# Patient Record
Sex: Female | Born: 1990
Health system: Southern US, Community
[De-identification: ages and names within clinical notes are randomized; demographics above are authoritative.]

## PROBLEM LIST (undated history)

## (undated) DIAGNOSIS — Z8679 Personal history of other diseases of the circulatory system: Secondary | ICD-10-CM

## (undated) DIAGNOSIS — Z789 Other specified health status: Secondary | ICD-10-CM

## (undated) HISTORY — DX: Personal history of other diseases of the circulatory system: Z86.79

## (undated) HISTORY — PX: WISDOM TOOTH EXTRACTION: SHX21

## (undated) HISTORY — DX: Other specified health status: Z78.9

---
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
## 2012-05-09 ENCOUNTER — Encounter: Payer: Self-pay | Admitting: Obstetrics and Gynecology

## 2012-06-13 ENCOUNTER — Ambulatory Visit (INDEPENDENT_AMBULATORY_CARE_PROVIDER_SITE_OTHER): Payer: 59 | Admitting: Obstetrics and Gynecology

## 2012-06-13 ENCOUNTER — Encounter: Payer: Self-pay | Admitting: Obstetrics and Gynecology

## 2012-06-13 VITALS — BP 122/72 | Ht 65.0 in | Wt 117.0 lb

## 2012-06-13 DIAGNOSIS — B354 Tinea corporis: Secondary | ICD-10-CM

## 2012-06-13 DIAGNOSIS — Z113 Encounter for screening for infections with a predominantly sexual mode of transmission: Secondary | ICD-10-CM

## 2012-06-13 DIAGNOSIS — Z0000 Encounter for general adult medical examination without abnormal findings: Secondary | ICD-10-CM

## 2012-06-13 DIAGNOSIS — Z01419 Encounter for gynecological examination (general) (routine) without abnormal findings: Secondary | ICD-10-CM

## 2012-06-13 LAB — HEPATITIS C ANTIBODY: HCV Ab: NEGATIVE

## 2012-06-13 LAB — HIV ANTIBODY (ROUTINE TESTING W REFLEX): HIV: NONREACTIVE

## 2012-06-13 MED ORDER — MICONAZOLE NITRATE 2 % EX CREA: TOPICAL_CREAM | Freq: Two times a day (BID) | CUTANEOUS | Status: DC

## 2012-06-13 MED ORDER — NORETHIN ACE-ETH ESTRAD-FE 1-20 MG-MCG PO TABS: 1.0000 | ORAL_TABLET | Freq: Every day | ORAL | Status: DC

## 2012-06-13 NOTE — Patient Instructions (Addendum)
EXERCISE AND DIET:  We recommended that you start or continue a regular exercise program for good health. Regular exercise means any activity that makes your heart beat faster and makes you sweat.  We recommend exercising at least 30 minutes per day at least 3 days a week, preferably 4 or 5.  We also recommend a diet low in fat and sugar.  Inactivity, poor dietary choices and obesity can cause diabetes, heart attack, stroke, and kidney damage, among others.    ALCOHOL AND SMOKING:  Women should limit their alcohol intake to no more than 7 drinks/beers/glasses of wine (combined, not each!) per week. Moderation of alcohol intake to this level decreases your risk of breast cancer and liver damage. And of course, no recreational drugs are part of a healthy lifestyle.  And absolutely no smoking or even second hand smoke. Most people know smoking can cause heart and lung diseases, but did you know it also contributes to weakening of your bones? Aging of your skin?  Yellowing of your teeth and nails?  CALCIUM AND VITAMIN D:  Adequate intake of calcium and Vitamin D are recommended.  The recommendations for exact amounts of these supplements seem to change often, but generally speaking 600 mg of calcium (either carbonate or citrate) and 800 units of Vitamin D per day seems prudent. Certain women may benefit from higher intake of Vitamin D.  If you are among these women, your doctor will have told you during your visit.    PAP SMEARS:  Pap smears, to check for cervical cancer or precancers,  have traditionally been done yearly, although recent scientific advances have shown that most women can have pap smears less often.  However, every woman still should have a physical exam from her gynecologist every year. It will include a breast check, inspection of the vulva and vagina to check for abnormal growths or skin changes, a visual exam of the cervix, and then an exam to evaluate the size and shape of the uterus and  ovaries.  And after 22 years of age, a rectal exam is indicated to check for rectal cancers. We will also provide age appropriate advice regarding health maintenance, like when you should have certain vaccines, screening for sexually transmitted diseases, bone density testing, colonoscopy, mammograms, etc.   MAMMOGRAMS:  All women over 40 years old should have a yearly mammogram. Many facilities now offer a "3D" mammogram, which may cost around $50 extra out of pocket. If possible,  we recommend you accept the option to have the 3D mammogram performed.  It both reduces the number of women who will be called back for extra views which then turn out to be normal, and it is better than the routine mammogram at detecting truly abnormal areas.    COLONOSCOPY:  Colonoscopy to screen for colon cancer is recommended for all women at age 50.  We know, you hate the idea of the prep.  We agree, BUT, having colon cancer and not knowing it is worse!!  Colon cancer so often starts as a polyp that can be seen and removed at colonscopy, which can quite literally save your life!  And if your first colonoscopy is normal and you have no family history of colon cancer, most women don't have to have it again for 10 years.  Once every ten years, you can do something that may end up saving your life, right?  We will be happy to help you get it scheduled when you are ready.    Be sure to check your insurance coverage so you understand how much it will cost.  It may be covered as a preventative service at no cost, but you should check your particular policy.    Fungus Infection of the Skin An infection of your skin caused by a fungus is a very common problem. Treatment depends on which part of the body is affected. Types of fungal skin infection include:  Athlete's Foot(Tinea pedis). This infection starts between the toes and may involve the entire sole and sides of foot. It is the most common fungal disease. It is made worse by  heat, moisture, and friction. To treat, wash your feet 2 to 3 times daily. Dry thoroughly between the toes. Use medicated foot powder or cream as directed on the package. Plain talc, cornstarch, or rice powder may be dusted into socks and shoes to keep the feet dry. Wearing footwear that allows ventilation is also helpful.  Ringworm (Tinea corporis and tinea capitis). This infection causes scaly red rings to form on the skin or scalp. For skin sores, apply medicated lotion or cream as directed on the package. For the scalp, medicated shampoo may be used with with other therapies. Ringworm of the scalp or fingernails usually requires using oral medicine for 2 to 4 months.  Tinea versicolor. This infection appears as painless, scaly, patchy areas of discolored skin (whitish to light brown). It is more common in the summer and favors oily areas of the skin such as those found at the chest, abdomen, back, pubis, neck, and body folds. It can be treated with medicated shampoo or with medicated topical cream. Oral antifungals may be needed for more active infections. The light and/or dark spots may take time to get better and is not a sign of treatment failure. Fungal infections may need to be treated for several weeks to be cured. It is important not to treat fungal infections with steroids or combination medicine that contains an antifungal and steroid as these will make the fungal infection worse. SEEK MEDICAL CARE IF:   You have persistent itching or rawness.  You have an oral temperature above 102 F (38.9 C). Document Released: 03/09/2004 Document Revised: 04/24/2011 Document Reviewed: 05/25/2009 [facility] Patient Information 2013 [place], [place]. Oral Contraception Information Oral contraceptives (OCs) are medicines taken to prevent pregnancy. OCs work by preventing the ovaries from releasing eggs. The hormones in OCs also cause the cervical mucus to thicken, preventing the sperm from entering the  uterus. The hormones also cause the uterine lining to become thin, not allowing a fertilized egg to attach to the inside of the uterus. OCs are highly effective when taken exactly as prescribed. However, OCs do not prevent sexually transmitted diseases (STDs). Safe sex practices, such as using condoms along with the pill, can help prevent STDs.  Before taking the pill, you may have a physical exam and Pap test. Your caregiver may order blood tests that may be necessary. Your caregiver will make sure you are a good candidate for oral contraception. Discuss with your caregiver the possible side effects of the OC you may be prescribed. When starting an OC, it can take 2 to 3 months for the body to adjust to the changes in hormone levels in your body.  TYPES OF ORAL CONTRACEPTION  The combination pill. This pill contains estrogen and progestin (synthetic progesterone) hormones. The combination pill comes in either 21-day or 28-day packs. With 21-day packs, you do not take pills for 7 days after the  last pill. With 28-day packs, the pill is taken every day. The last 7 pills are without hormones. Certain types of pills have more than 21 hormone-containing pills.  The minipill. This pill contains the progesterone hormone only. It is taken every day continuously. The minipill comes in packs of 91 pills. The first 84 pills contain the hormones, and the last 7 pills do not. The last 7 days are when you will have your menstrual period. You may experience irregular spotting. ADVANTAGES  Decreases premenstrual symptoms.  Treats menstrual period cramps.  Regulates the menstrual cycle.  Decreases a heavy menstrual flow.  Treats acne.  Treats abnormal uterine bleeding.  Treats chronic pelvic pain.  Treats polycystic ovarian syndrome.  Treats endometriosis.  Can be used as emergency contraception. DISADVANTAGES OCs can be less effective if:  You forget to take the pill at the same time every  day.  You have a stomach or intestinal disease that lessens the absorption of the pill.  You take OCs with other medicines that make OCs less effective.  You take expired OCs.  You forget to restart the pill on day 7, when using the packs of 21 pills. Document Released: 04/22/2002 Document Revised: 04/24/2011 Document Reviewed: 06/08/2010 [facility] Patient Information 2013 [place], [place].

## 2012-06-13 NOTE — Progress Notes (Signed)
Patient ID: Brittney Collins, female   DOB: [birth year], 22 y.o.   MRN: [id number] 22 y.o.  Single  Caucasian female   G0P0 here for annual exam.   Wants to start OCPs. Has bad cramping for first one to two days.  Aleve or Advil works most of the time.    Patient's last menstrual period was 06/02/2012.          Sexually active: yes  The current method of family planning is condoms all the time.    Exercising: No Last mammogram:  never Last pap smear: never History of abnormal pap: n/a Smoking: no Alcohol: rare Last colonoscopy: never Last Bone Density:  never Last tetanus shot: up to date Last cholesterol check: unsure  Hgb: 14.1               Urine:  Unable to void.    No health maintenance topics applied.  History reviewed. No pertinent family history.  There are no active problems to display for this patient.   History reviewed. No pertinent past medical history.  Past Surgical History  Procedure Laterality Date  . Wisdom tooth extraction      Allergies: Review of patient's allergies indicates no known allergies.  No current outpatient prescriptions on file.   No current facility-administered medications for this visit.    ROS: Pertinent items are noted in HPI.  Social Hx:  Patient is in committed relationship.  Is a CMA at [employer].  Exam:    BP 122/72  Ht 5\' 5"  (1.651 m)  Wt 117 lb (53.071 kg)  BMI 19.47 kg/m2  LMP 06/02/2012   Wt Readings from Last 3 Encounters:  06/13/12 117 lb (53.071 kg)     Ht Readings from Last 3 Encounters:  06/13/12 5\' 5"  (1.651 m)    General appearance: alert, cooperative and appears stated age Head: Normocephalic, without obvious abnormality, atraumatic Neck: no adenopathy, supple, symmetrical, trachea midline and thyroid not enlarged, symmetric, no tenderness/mass/nodules Lungs: clear to auscultation bilaterally Breasts: Inspection negative, No nipple retraction or dimpling, No nipple discharge or bleeding, No  axillary or supraclavicular adenopathy, Normal to palpation without dominant masses Heart: regular rate and rhythm Abdomen: soft, non-tender; bowel sounds normal; no masses,  no organomegaly Extremities: extremities normal, atraumatic, no cyanosis or edema Skin: Skin color, texture, turgor normal. No rashes or lesions.  Very tanned skin.  Has 2 circular nonraised patches lacking pigmentation on lower chest. Lymph nodes: Cervical, supraclavicular, and axillary nodes normal. No abnormal inguinal nodes palpated Neurologic: Grossly normal   Pelvic: External genitalia:  no lesions              Urethra:  normal appearing urethra with no masses, tenderness or lesions              Bartholins and Skenes: normal                 Vagina: normal appearing vagina with normal color and discharge, no lesions              Cervix: normal appearance              Pap taken: yes and reflex HR HPV and GC/CT.        Bimanual Exam:  Uterus:  uterus is normal size, shape, consistency and nontender  Adnexa: normal adnexa in size, nontender and no masses                                      Rectovaginal: Confirms                                      Anus:  normal sphincter tone, no lesions  A: normal gyn exam Need for reliable contraception No prior STD testing Tinea corporis.     P: mammogram at age 41 pap smear with reflex HPV and GC/CT STD panel - HIV, RPR, Hep C aby Condoms for STD prevention LoEstrin 1/20 - see EPIC orders.  Patient instructed in use, risks, and benefits. Miconazole topical 2% cream to skin bid for 2 - 3 weeks. If skin lesions persist, see your PCP.  return annually or prn     An After Visit Summary was printed and given to the patient.

## 2012-06-14 LAB — RPR

## 2012-06-17 LAB — IPS PAP TEST WITH REFLEX TO HPV

## 2012-06-18 LAB — IPS N GONORRHOEA AND CHLAMYDIA BY PCR

## 2012-06-21 LAB — HEMOGLOBIN, FINGERSTICK: Hemoglobin, fingerstick: 14.1 g/dL (ref 12.0–16.0)

## 2012-12-19 ENCOUNTER — Other Ambulatory Visit: Payer: Self-pay

## 2013-05-28 ENCOUNTER — Encounter: Payer: Self-pay | Admitting: Obstetrics and Gynecology

## 2013-06-20 ENCOUNTER — Ambulatory Visit (INDEPENDENT_AMBULATORY_CARE_PROVIDER_SITE_OTHER): Payer: 59 | Admitting: Obstetrics and Gynecology

## 2013-06-20 ENCOUNTER — Encounter: Payer: Self-pay | Admitting: Obstetrics and Gynecology

## 2013-06-20 ENCOUNTER — Ambulatory Visit: Payer: 59 | Admitting: Obstetrics and Gynecology

## 2013-06-20 VITALS — BP 110/74 | HR 64 | Ht 65.0 in | Wt 124.2 lb

## 2013-06-20 DIAGNOSIS — Z113 Encounter for screening for infections with a predominantly sexual mode of transmission: Secondary | ICD-10-CM

## 2013-06-20 DIAGNOSIS — Z0000 Encounter for general adult medical examination without abnormal findings: Secondary | ICD-10-CM

## 2013-06-20 DIAGNOSIS — Z01419 Encounter for gynecological examination (general) (routine) without abnormal findings: Secondary | ICD-10-CM

## 2013-06-20 LAB — POCT URINALYSIS DIPSTICK
Bilirubin, UA: NEGATIVE
Blood, UA: NEGATIVE
Glucose, UA: NEGATIVE
Ketones, UA: NEGATIVE
Leukocytes, UA: NEGATIVE
Nitrite, UA: NEGATIVE
Protein, UA: NEGATIVE
Urobilinogen, UA: NEGATIVE
pH, UA: 8

## 2013-06-20 LAB — HEMOGLOBIN, FINGERSTICK: Hemoglobin, fingerstick: 14.7 g/dL (ref 12.0–16.0)

## 2013-06-20 NOTE — Progress Notes (Signed)
Patient ID: Brittney Collins, female   DOB: September 21, 1990, 23 y.o.   MRN: [id number] GYNECOLOGY VISIT  [facility] [place]  Referring provider:   HPI: 23 y.o.   Single  Caucasian  female   G0P0 with Patient's last menstrual period was 05/28/2013.   here for  AEX.   Did not start OCPs. Does not want to take pills. Steady partner for 2 years. Wants STD testing.   No Gardasil vaccine yet.    Mother just diagnosed with breast cancer at age 23.   Hgb:   14.7 Urine:  Neg  GYNECOLOGIC HISTORY: Patient's last menstrual period was 05/28/2013. Sexually active:  yes Partner preference: female Contraception:  Condoms everytime  Menopausal hormone therapy: n/a DES exposure:   no Blood transfusions:  no  Sexually transmitted diseases:   no GYN procedures and prior surgeries:  no Last mammogram:   n/a              Last pap and high risk HPV testing:   06-13-12 wnl History of abnormal pap smear:  no   OB History   Grav Para Term Preterm Abortions TAB SAB Ect Mult Living   0                LIFESTYLE: Exercise:   no            Tobacco:  no Alcohol:     no Drug use:  no  OTHER HEALTH MAINTENANCE: Tetanus/TDap:   2010 Gardisil:              no Influenza:            2013 Zostavax:            n/a  Bone density:      n/a Colonoscopy:      n/a  Cholesterol check:   never  Family History  Problem Relation Age of Onset  . Breast cancer Mother [id number]  . Hypertension Mother     There are no active problems to display for this patient.  History reviewed. No pertinent past medical history.  Past Surgical History  Procedure Laterality Date  . Wisdom tooth extraction      ALLERGIES: Review of patient's allergies indicates no known allergies.  No current outpatient prescriptions on file.   No current facility-administered medications for this visit.     ROS:  Pertinent items are noted in HPI.  SOCIAL HISTORY:  Engineer, siteMedical assistant.   PHYSICAL  EXAMINATION:    BP 110/74  Pulse 64  Ht 5\' 5"  (1.651 m)  Wt 124 lb 3.2 oz (56.337 kg)  BMI 20.67 kg/m2  LMP 05/28/2013   Wt Readings from Last 3 Encounters:  06/20/13 124 lb 3.2 oz (56.337 kg)  06/13/12 117 lb (53.071 kg)     Ht Readings from Last 3 Encounters:  06/20/13 5\' 5"  (1.651 m)  06/13/12 5\' 5"  (1.651 m)    General appearance: alert, cooperative and appears stated age Head: Normocephalic, without obvious abnormality, atraumatic Neck: no adenopathy, supple, symmetrical, trachea midline and thyroid not enlarged, symmetric, no tenderness/mass/nodules Lungs: clear to auscultation bilaterally Breasts: Inspection negative, No nipple retraction or dimpling, No nipple discharge or bleeding, No axillary or supraclavicular adenopathy, Normal to palpation without dominant masses Heart: regular rate and rhythm Abdomen: soft, non-tender; no masses,  no organomegaly Extremities: extremities normal, atraumatic, no cyanosis or edema Skin: Skin color, texture, turgor normal. No rashes or lesions Lymph nodes: Cervical, supraclavicular, and axillary nodes  normal. No abnormal inguinal nodes palpated Neurologic: Grossly normal  Pelvic: External genitalia:  no lesions              Urethra:  normal appearing urethra with no masses, tenderness or lesions              Bartholins and Skenes: normal                 Vagina: normal appearing vagina with normal color and discharge, no lesions              Cervix: normal appearance              Pap and high risk HPV testing done: no.            Bimanual Exam:  Uterus:  uterus is normal size, shape, consistency and nontender                                      Adnexa: normal adnexa in size, nontender and no masses                                        ASSESSMENT  Normal gynecologic exam. Condoms for birth control Family history of breast cancer.   PLAN  Mammogram recommended yearly starting age 23.  Pap smear in 2 years. Counseled on self  breast exam, Calcium and vitamin D intake, exercise. I discussed PNV as a way to reduce spina bifida and miscarriageif did become pregnant.  STD testing today.  Return annually or prn   An After Visit Summary was printed and given to the patient.

## 2013-06-20 NOTE — Patient Instructions (Signed)

## 2013-06-21 LAB — GC/CHLAMYDIA PROBE AMP, URINE
Chlamydia, Swab/Urine, PCR: NEGATIVE
GC Probe Amp, Urine: NEGATIVE

## 2013-06-21 LAB — STD PANEL
HIV 1&2 Ab, 4th Generation: NONREACTIVE
Hepatitis B Surface Ag: NEGATIVE

## 2013-06-21 LAB — HEPATITIS C ANTIBODY: HCV Ab: NEGATIVE

## 2013-10-27 ENCOUNTER — Encounter: Payer: Self-pay | Admitting: Obstetrics and Gynecology

## 2013-11-28 ENCOUNTER — Other Ambulatory Visit: Payer: Self-pay

## 2014-06-26 ENCOUNTER — Ambulatory Visit: Payer: 59 | Admitting: Obstetrics and Gynecology

## 2014-07-16 ENCOUNTER — Ambulatory Visit (INDEPENDENT_AMBULATORY_CARE_PROVIDER_SITE_OTHER): Payer: 59 | Admitting: Otolaryngology

## 2014-07-16 DIAGNOSIS — R040 Epistaxis: Secondary | ICD-10-CM

## 2014-08-31 ENCOUNTER — Encounter: Payer: Self-pay | Admitting: Obstetrics and Gynecology

## 2014-08-31 ENCOUNTER — Ambulatory Visit (INDEPENDENT_AMBULATORY_CARE_PROVIDER_SITE_OTHER): Payer: Commercial Managed Care - HMO | Admitting: Obstetrics and Gynecology

## 2014-08-31 VITALS — BP 120/72 | HR 76 | Resp 16 | Ht 66.0 in | Wt 119.6 lb

## 2014-08-31 DIAGNOSIS — Z0000 Encounter for general adult medical examination without abnormal findings: Secondary | ICD-10-CM | POA: Diagnosis not present

## 2014-08-31 DIAGNOSIS — Z113 Encounter for screening for infections with a predominantly sexual mode of transmission: Secondary | ICD-10-CM

## 2014-08-31 DIAGNOSIS — Z01419 Encounter for gynecological examination (general) (routine) without abnormal findings: Secondary | ICD-10-CM

## 2014-08-31 LAB — LIPID PANEL
Cholesterol: 150 mg/dL (ref 0–200)
HDL: 51 mg/dL (ref >=46)
LDL Cholesterol: 85 mg/dL (ref 0–99)
Total CHOL/HDL Ratio: 2.9 Ratio
Triglycerides: 68 mg/dL (ref <150)
VLDL: 14 mg/dL (ref 0–40)

## 2014-08-31 LAB — COMPREHENSIVE METABOLIC PANEL
ALT: 12 U/L (ref 0–35)
AST: 16 U/L (ref 0–37)
Albumin: 4.4 g/dL (ref 3.5–5.2)
Alkaline Phosphatase: 36 U/L — ABNORMAL LOW (ref 39–117)
BUN: 13 mg/dL (ref 6–23)
CO2: 26 mEq/L (ref 19–32)
Calcium: 10 mg/dL (ref 8.4–10.5)
Chloride: 106 mEq/L (ref 96–112)
Creat: 0.73 mg/dL (ref 0.50–1.10)
Glucose, Bld: 89 mg/dL (ref 70–99)
Potassium: 3.8 mEq/L (ref 3.5–5.3)
Sodium: 141 mEq/L (ref 135–145)
Total Bilirubin: 0.4 mg/dL (ref 0.2–1.2)
Total Protein: 7.2 g/dL (ref 6.0–8.3)

## 2014-08-31 LAB — CBC
HCT: 40.2 % (ref 36.0–46.0)
Hemoglobin: 13.5 g/dL (ref 12.0–15.0)
MCH: 30.5 pg (ref 26.0–34.0)
MCHC: 33.6 g/dL (ref 30.0–36.0)
MCV: 91 fL (ref 78.0–100.0)
MPV: 9.5 fL (ref 8.6–12.4)
Platelets: 317 10*3/uL (ref 150–400)
RBC: 4.42 MIL/uL (ref 3.87–5.11)
RDW: 13.3 % (ref 11.5–15.5)
WBC: 7.2 10*3/uL (ref 4.0–10.5)

## 2014-08-31 LAB — POCT URINALYSIS DIPSTICK
Bilirubin, UA: NEGATIVE
Blood, UA: NEGATIVE
Glucose, UA: NEGATIVE
Ketones, UA: NEGATIVE
Leukocytes, UA: NEGATIVE
Nitrite, UA: NEGATIVE
Protein, UA: NEGATIVE
Urobilinogen, UA: NEGATIVE
pH, UA: 8

## 2014-08-31 NOTE — Progress Notes (Signed)
24 y.o. G0P0 Single Caucasian female here for annual exam.    Wants to have general labs and STD testing OK to do.   Getting married in September.   No problems with menses.  No problems with heaviness or crampiness.  PCP:   None  No LMP recorded.  08/21/14 Sexually active: Yes.    The current method of family planning is condoms.    Exercising: No.  none Smoker:  no  Health Maintenance: Pap:  06/13/12 Neg.  History of abnormal Pap:  no MMG:  Never TDaP:  2010 Screening Labs: drawn  Hb today:13.9, Urine today: neg PH: 8.0   reports that she has never smoked. She does not have any smokeless tobacco history on file. She reports that she does not drink alcohol or use illicit drugs.  No past medical history on file.  Past Surgical History  Procedure Laterality Date  . Wisdom tooth extraction      No current outpatient prescriptions on file.   No current facility-administered medications for this visit.    Family History  Problem Relation Age of Onset  . Breast cancer Mother 22  . Hypertension Mother     ROS:  Pertinent items are noted in HPI.  Otherwise, a comprehensive ROS was negative.  Exam:   There were no vitals taken for this visit.    General appearance: alert, cooperative and appears stated age Head: Normocephalic, without obvious abnormality, atraumatic Neck: no adenopathy, supple, symmetrical, trachea midline and thyroid normal to inspection and palpation Lungs: clear to auscultation bilaterally Breasts: normal appearance, no masses or tenderness, Inspection negative, No nipple retraction or dimpling, No nipple discharge or bleeding, No axillary or supraclavicular adenopathy Heart: regular rate and rhythm Abdomen: soft, non-tender; bowel sounds normal; no masses,  no organomegaly Extremities: extremities normal, atraumatic, no cyanosis or edema Skin: Skin color, texture, turgor normal. No rashes or lesions Lymph nodes: Cervical, supraclavicular, and axillary  nodes normal. No abnormal inguinal nodes palpated Neurologic: Grossly normal  Pelvic: External genitalia:  no lesions              Urethra:  normal appearing urethra with no masses, tenderness or lesions              Bartholins and Skenes: normal                 Vagina: normal appearing vagina with normal color and discharge, no lesions              Cervix: no lesions              Pap taken: No. Bimanual Exam:  Uterus:  normal size, contour, position, consistency, mobility, non-tender              Adnexa: normal adnexa and no mass, fullness, tenderness              Rectovaginal: No..    Chaperone was present for exam.  Assessment:   Well woman visit with normal exam.  FH of breast cancer.   Plan: Yearly mammogram recommended after age 26.  Recommended self breast exam.  Pap and HR HPV as above. Discussed Calcium, Vitamin D, regular exercise program including cardiovascular and weight bearing exercise. Labs performed.  Yes.  .   See orders. Refills given on medications.  No..  See orders. Follow up annually and prn.   Additional counseling givenregarding contraception options - pills, Ortho Evra, NuvaRing, Skyla, ParaGard, Nexplanon, Depo Provera. Written information as well.  Will return if would like further discussion after doing more study. In the mean time, use condoms with spermicide.  After visit summary provided.

## 2014-08-31 NOTE — Patient Instructions (Signed)
Contraception Choices Contraception (birth control) is the use of any methods or devices to prevent pregnancy. Below are some methods to help avoid pregnancy. HORMONAL METHODS   Contraceptive implant. This is a thin, plastic tube containing progesterone hormone. It does not contain estrogen hormone. Your health care provider inserts the tube in the inner part of the upper arm. The tube can remain in place for up to 3 years. After 3 years, the implant must be removed. The implant prevents the ovaries from releasing an egg (ovulation), thickens the cervical mucus to prevent sperm from entering the uterus, and thins the lining of the inside of the uterus.  Progesterone-only injections. These injections are given every 3 months by your health care provider to prevent pregnancy. This synthetic progesterone hormone stops the ovaries from releasing eggs. It also thickens cervical mucus and changes the uterine lining. This makes it harder for sperm to survive in the uterus.  Birth control pills. These pills contain estrogen and progesterone hormone. They work by preventing the ovaries from releasing eggs (ovulation). They also cause the cervical mucus to thicken, preventing the sperm from entering the uterus. Birth control pills are prescribed by a health care provider.Birth control pills can also be used to treat heavy periods.  Minipill. This type of birth control pill contains only the progesterone hormone. They are taken every day of each month and must be prescribed by your health care provider.  Birth control patch. The patch contains hormones similar to those in birth control pills. It must be changed once a week and is prescribed by a health care provider.  Vaginal ring. The ring contains hormones similar to those in birth control pills. It is left in the vagina for 3 weeks, removed for 1 week, and then a new one is put back in place. The patient must be comfortable inserting and removing the ring  from the vagina.A health care provider's prescription is necessary.  Emergency contraception. Emergency contraceptives prevent pregnancy after unprotected sexual intercourse. This pill can be taken right after sex or up to 5 days after unprotected sex. It is most effective the sooner you take the pills after having sexual intercourse. Most emergency contraceptive pills are available without a prescription. Check with your pharmacist. Do not use emergency contraception as your only form of birth control. BARRIER METHODS   Female condom. This is a thin sheath (latex or rubber) that is worn over the penis during sexual intercourse. It can be used with spermicide to increase effectiveness.  Female condom. This is a soft, loose-fitting sheath that is put into the vagina before sexual intercourse.  Diaphragm. This is a soft, latex, dome-shaped barrier that must be fitted by a health care provider. It is inserted into the vagina, along with a spermicidal jelly. It is inserted before intercourse. The diaphragm should be left in the vagina for 6 to 8 hours after intercourse.  Cervical cap. This is a round, soft, latex or plastic cup that fits over the cervix and must be fitted by a health care provider. The cap can be left in place for up to 48 hours after intercourse.  Sponge. This is a soft, circular piece of polyurethane foam. The sponge has spermicide in it. It is inserted into the vagina after wetting it and before sexual intercourse.  Spermicides. These are chemicals that kill or block sperm from entering the cervix and uterus. They come in the form of creams, jellies, suppositories, foam, or tablets. They do not require a   prescription. They are inserted into the vagina with an applicator before having sexual intercourse. The process must be repeated every time you have sexual intercourse. INTRAUTERINE CONTRACEPTION  Intrauterine device (IUD). This is a T-shaped device that is put in a woman's uterus  during a menstrual period to prevent pregnancy. There are 2 types:  Copper IUD. This type of IUD is wrapped in copper wire and is placed inside the uterus. Copper makes the uterus and fallopian tubes produce a fluid that kills sperm. It can stay in place for 10 years.  Hormone IUD. This type of IUD contains the hormone progestin (synthetic progesterone). The hormone thickens the cervical mucus and prevents sperm from entering the uterus, and it also thins the uterine lining to prevent implantation of a fertilized egg. The hormone can weaken or kill the sperm that get into the uterus. It can stay in place for 3-5 years, depending on which type of IUD is used. PERMANENT METHODS OF CONTRACEPTION  Female tubal ligation. This is when the woman's fallopian tubes are surgically sealed, tied, or blocked to prevent the egg from traveling to the uterus.  Hysteroscopic sterilization. This involves placing a small coil or insert into each fallopian tube. Your doctor uses a technique called hysteroscopy to do the procedure. The device causes scar tissue to form. This results in permanent blockage of the fallopian tubes, so the sperm cannot fertilize the egg. It takes about 3 months after the procedure for the tubes to become blocked. You must use another form of birth control for these 3 months.  Female sterilization. This is when the female has the tubes that carry sperm tied off (vasectomy).This blocks sperm from entering the vagina during sexual intercourse. After the procedure, the man can still ejaculate fluid (semen). NATURAL PLANNING METHODS  Natural family planning. This is not having sexual intercourse or using a barrier method (condom, diaphragm, cervical cap) on days the woman could become pregnant.  Calendar method. This is keeping track of the length of each menstrual cycle and identifying when you are fertile.  Ovulation method. This is avoiding sexual intercourse during ovulation.  Symptothermal  method. This is avoiding sexual intercourse during ovulation, using a thermometer and ovulation symptoms.  Post-ovulation method. This is timing sexual intercourse after you have ovulated. Regardless of which type or method of contraception you choose, it is important that you use condoms to protect against the transmission of sexually transmitted infections (STIs). Talk with your health care provider about which form of contraception is most appropriate for you. Document Released: 01/30/2005 Document Revised: 02/04/2013 Document Reviewed: 07/25/2012 [facility] Patient Information 2015 [place], [place]. This information is not intended to replace advice given to you by your health care provider. Make sure you discuss any questions you have with your health care provider.  EXERCISE AND DIET:  We recommended that you start or continue a regular exercise program for good health. Regular exercise means any activity that makes your heart beat faster and makes you sweat.  We recommend exercising at least 30 minutes per day at least 3 days a week, preferably 4 or 5.  We also recommend a diet low in fat and sugar.  Inactivity, poor dietary choices and obesity can cause diabetes, heart attack, stroke, and kidney damage, among others.    ALCOHOL AND SMOKING:  Women should limit their alcohol intake to no more than 7 drinks/beers/glasses of wine (combined, not each!) per week. Moderation of alcohol intake to this level decreases your risk of breast  cancer and liver damage. And of course, no recreational drugs are part of a healthy lifestyle.  And absolutely no smoking or even second hand smoke. Most people know smoking can cause heart and lung diseases, but did you know it also contributes to weakening of your bones? Aging of your skin?  Yellowing of your teeth and nails?  CALCIUM AND VITAMIN D:  Adequate intake of calcium and Vitamin D are recommended.  The recommendations for exact amounts of these supplements seem  to change often, but generally speaking 600 mg of calcium (either carbonate or citrate) and 800 units of Vitamin D per day seems prudent. Certain women may benefit from higher intake of Vitamin D.  If you are among these women, your doctor will have told you during your visit.    PAP SMEARS:  Pap smears, to check for cervical cancer or precancers,  have traditionally been done yearly, although recent scientific advances have shown that most women can have pap smears less often.  However, every woman still should have a physical exam from her gynecologist every year. It will include a breast check, inspection of the vulva and vagina to check for abnormal growths or skin changes, a visual exam of the cervix, and then an exam to evaluate the size and shape of the uterus and ovaries.  And after 24 years of age, a rectal exam is indicated to check for rectal cancers. We will also provide age appropriate advice regarding health maintenance, like when you should have certain vaccines, screening for sexually transmitted diseases, bone density testing, colonoscopy, mammograms, etc.   MAMMOGRAMS:  All women over 336 years old should have a yearly mammogram. Many facilities now offer a "3D" mammogram, which may cost around $50 extra out of pocket. If possible,  we recommend you accept the option to have the 3D mammogram performed.  It both reduces the number of women who will be called back for extra views which then turn out to be normal, and it is better than the routine mammogram at detecting truly abnormal areas.    COLONOSCOPY:  Colonoscopy to screen for colon cancer is recommended for all women at age 24.  We know, you hate the idea of the prep.  We agree, BUT, having colon cancer and not knowing it is worse!!  Colon cancer so often starts as a polyp that can be seen and removed at colonscopy, which can quite literally save your life!  And if your first colonoscopy is normal and you have no family history of colon  cancer, most women don't have to have it again for 10 years.  Once every ten years, you can do something that may end up saving your life, right?  We will be happy to help you get it scheduled when you are ready.  Be sure to check your insurance coverage so you understand how much it will cost.  It may be covered as a preventative service at no cost, but you should check your particular policy.

## 2014-09-01 LAB — STD PANEL
HIV 1&2 Ab, 4th Generation: NONREACTIVE
Hepatitis B Surface Ag: NEGATIVE

## 2014-09-01 LAB — HEPATITIS C ANTIBODY: HCV Ab: NEGATIVE

## 2014-09-02 LAB — IPS N GONORRHOEA AND CHLAMYDIA BY PCR

## 2015-09-03 ENCOUNTER — Ambulatory Visit: Payer: 59 | Admitting: Obstetrics and Gynecology

## 2015-09-10 ENCOUNTER — Ambulatory Visit (INDEPENDENT_AMBULATORY_CARE_PROVIDER_SITE_OTHER): Payer: Commercial Managed Care - HMO | Admitting: Obstetrics and Gynecology

## 2015-09-10 ENCOUNTER — Encounter: Payer: Self-pay | Admitting: Obstetrics and Gynecology

## 2015-09-10 VITALS — BP 102/60 | HR 56 | Resp 12 | Ht 64.75 in | Wt 116.2 lb

## 2015-09-10 DIAGNOSIS — Z0000 Encounter for general adult medical examination without abnormal findings: Secondary | ICD-10-CM

## 2015-09-10 DIAGNOSIS — Z01419 Encounter for gynecological examination (general) (routine) without abnormal findings: Secondary | ICD-10-CM

## 2015-09-10 LAB — COMPREHENSIVE METABOLIC PANEL
ALT: 15 U/L (ref 6–29)
AST: 18 U/L (ref 10–30)
Albumin: 4.5 g/dL (ref 3.6–5.1)
Alkaline Phosphatase: 40 U/L (ref 33–115)
BUN: 10 mg/dL (ref 7–25)
CO2: 24 mmol/L (ref 20–31)
Calcium: 9.7 mg/dL (ref 8.6–10.2)
Chloride: 104 mmol/L (ref 98–110)
Creat: 0.74 mg/dL (ref 0.50–1.10)
Glucose, Bld: 79 mg/dL (ref 65–99)
Potassium: 3.8 mmol/L (ref 3.5–5.3)
Sodium: 139 mmol/L (ref 135–146)
Total Bilirubin: 0.3 mg/dL (ref 0.2–1.2)
Total Protein: 7.1 g/dL (ref 6.1–8.1)

## 2015-09-10 LAB — LIPID PANEL
Cholesterol: 185 mg/dL (ref 125–200)
HDL: 63 mg/dL (ref >=46)
LDL Cholesterol: 103 mg/dL (ref <130)
Total CHOL/HDL Ratio: 2.9 Ratio (ref <=5.0)
Triglycerides: 95 mg/dL (ref <150)
VLDL: 19 mg/dL (ref <30)

## 2015-09-10 LAB — POCT URINALYSIS DIPSTICK
Bilirubin, UA: NEGATIVE
Blood, UA: NEGATIVE
Glucose, UA: NEGATIVE
Ketones, UA: NEGATIVE
Leukocytes, UA: NEGATIVE
Nitrite, UA: NEGATIVE
Protein, UA: NEGATIVE
Urobilinogen, UA: NEGATIVE
pH, UA: 8

## 2015-09-10 LAB — CBC
HCT: 40.9 % (ref 35.0–45.0)
Hemoglobin: 13.9 g/dL (ref 11.7–15.5)
MCH: 30.7 pg (ref 27.0–33.0)
MCHC: 34 g/dL (ref 32.0–36.0)
MCV: 90.3 fL (ref 80.0–100.0)
MPV: 9.5 fL (ref 7.5–12.5)
Platelets: 304 10*3/uL (ref 140–400)
RBC: 4.53 MIL/uL (ref 3.80–5.10)
RDW: 14.1 % (ref 11.0–15.0)
WBC: 8 10*3/uL (ref 3.8–10.8)

## 2015-09-10 NOTE — Progress Notes (Signed)
25 y.o. G0P0 Married Caucasian female here for annual exam.    Satisfied with condoms for contraception.   Works at [employer].  PCP:   Dr. Phillips Odor.   Patient's last menstrual period was 08/24/2015.     Period Cycle (Days): 28 Period Duration (Days): 7 Period Pattern: Regular Menstrual Flow: Heavy, Moderate Menstrual Control: Tampon Menstrual Control Change Freq (Hours): 1-2 hours on heavy days Dysmenorrhea: None     Sexually active: Yes.    The current method of family planning is Condoms.    Exercising: No.  The patient does not participate in regular exercise at present. Smoker:  no  Health Maintenance: Pap:  06/13/2012 WNL History of abnormal Pap:  no TDaP:  2010 Gardasil:   No.  Will consider.  HIV: 08/31/14 Neg Hep C:08/31/14 Neg Screening Labs:  Hb today: , Urine today: Neg   reports that she has never smoked. She has never used smokeless tobacco. She reports that she does not drink alcohol or use drugs.  No past medical history on file.  Past Surgical History:  Procedure Laterality Date  . WISDOM TOOTH EXTRACTION      No current outpatient prescriptions on file.   No current facility-administered medications for this visit.     Family History  Problem Relation Age of Onset  . Breast cancer Mother 25  . Hypertension Mother   not certain if her mother's aunt may have had breast cancer.  ROS:  Pertinent items are noted in HPI.  Otherwise, a comprehensive ROS was negative.  Exam:   BP 102/60 (BP Location: Right Arm, Patient Position: Sitting, Cuff Size: Normal)   Pulse (!) 56   Resp 12   Ht 5' 4.75" (1.645 m)   Wt 116 lb 3.2 oz (52.7 kg)   LMP 08/24/2015   BMI 19.49 kg/m     General appearance: alert, cooperative and appears stated age Head: Normocephalic, without obvious abnormality, atraumatic Neck: no adenopathy, supple, symmetrical, trachea midline and thyroid normal to inspection and palpation Lungs: clear to auscultation  bilaterally Breasts: normal appearance, no masses or tenderness, No nipple retraction or dimpling, No nipple discharge or bleeding, No axillary or supraclavicular adenopathy Heart: regular rate and rhythm Abdomen: soft, non-tender; no masses, no organomegaly Extremities: extremities normal, atraumatic, no cyanosis or edema Skin: Skin color, texture, turgor normal. No rashes or lesions Lymph nodes: Cervical, supraclavicular, and axillary nodes normal. No abnormal inguinal nodes palpated Neurologic: Grossly normal  Pelvic: External genitalia:  no lesions              Urethra:  normal appearing urethra with no masses, tenderness or lesions              Bartholins and Skenes: normal                 Vagina: normal appearing vagina with normal color and discharge, no lesions              Cervix: no lesions              Pap taken: Yes.   Bimanual Exam:  Uterus:  normal size, contour, position, consistency, mobility, non-tender              Adnexa: no mass, fullness, tenderness        Chaperone was present for exam.  Assessment:   Well woman visit with normal exam. FH breast cancer in mother.   Plan: Yearly mammogram recommended after age 26.  Recommended self breast  exam.  Pap and HR HPV as above. Discussed Calcium, Vitamin D, regular exercise program including cardiovascular and weight bearing exercise. Discussed Gardasil.  She will consider.  She may choose to do this at her employer's office. Routine labs drawn.   Follow up annually and prn.     After visit summary provided.

## 2015-09-10 NOTE — Patient Instructions (Signed)
Health Maintenance, Female Adopting a healthy lifestyle and getting preventive care can go a long way to promote health and wellness. Talk with your health care provider about what schedule of regular examinations is right for you. This is a good chance for you to check in with your provider about disease prevention and staying healthy. In between checkups, there are plenty of things you can do on your own. Experts have done a lot of research about which lifestyle changes and preventive measures are most likely to keep you healthy. Ask your health care provider for more information. WEIGHT AND DIET  Eat a healthy diet  Be sure to include plenty of vegetables, fruits, low-fat dairy products, and lean protein.  Do not eat a lot of foods high in solid fats, added sugars, or salt.  Get regular exercise. This is one of the most important things you can do for your health.  Most adults should exercise for at least 150 minutes each week. The exercise should increase your heart rate and make you sweat (moderate-intensity exercise).  Most adults should also do strengthening exercises at least twice a week. This is in addition to the moderate-intensity exercise.  Maintain a healthy weight  Body mass index (BMI) is a measurement that can be used to identify possible weight problems. It estimates body fat based on height and weight. Your health care provider can help determine your BMI and help you achieve or maintain a healthy weight.  For females 20 years of age and older:   A BMI below 18.5 is considered underweight.  A BMI of 18.5 to 24.9 is normal.  A BMI of 25 to 29.9 is considered overweight.  A BMI of 30 and above is considered obese.  Watch levels of cholesterol and blood lipids  You should start having your blood tested for lipids and cholesterol at 25 years of age, then have this test every 5 years.  You may need to have your cholesterol levels checked more often if:  Your lipid  or cholesterol levels are high.  You are older than 25 years of age.  You are at high risk for heart disease.  CANCER SCREENING   Lung Cancer  Lung cancer screening is recommended for adults 55-80 years old who are at high risk for lung cancer because of a history of smoking.  A yearly low-dose CT scan of the lungs is recommended for people who:  Currently smoke.  Have quit within the past 15 years.  Have at least a 30-pack-year history of smoking. A pack year is smoking an average of one pack of cigarettes a day for 1 year.  Yearly screening should continue until it has been 15 years since you quit.  Yearly screening should stop if you develop a health problem that would prevent you from having lung cancer treatment.  Breast Cancer  Practice breast self-awareness. This means understanding how your breasts normally appear and feel.  It also means doing regular breast self-exams. Let your health care provider know about any changes, no matter how small.  If you are in your 20s or 30s, you should have a clinical breast exam (CBE) by a health care provider every 1-3 years as part of a regular health exam.  If you are 40 or older, have a CBE every year. Also consider having a breast X-ray (mammogram) every year.  If you have a family history of breast cancer, talk to your health care provider about genetic screening.  If you   are at high risk for breast cancer, talk to your health care provider about having an MRI and a mammogram every year.  Breast cancer gene (BRCA) assessment is recommended for women who have family members with BRCA-related cancers. BRCA-related cancers include:  Breast.  Ovarian.  Tubal.  Peritoneal cancers.  Results of the assessment will determine the need for genetic counseling and BRCA1 and BRCA2 testing. Cervical Cancer Your health care provider may recommend that you be screened regularly for cancer of the pelvic organs (ovaries, uterus, and  vagina). This screening involves a pelvic examination, including checking for microscopic changes to the surface of your cervix (Pap test). You may be encouraged to have this screening done every 3 years, beginning at age 21.  For women ages 30-65, health care providers may recommend pelvic exams and Pap testing every 3 years, or they may recommend the Pap and pelvic exam, combined with testing for human papilloma virus (HPV), every 5 years. Some types of HPV increase your risk of cervical cancer. Testing for HPV may also be done on women of any age with unclear Pap test results.  Other health care providers may not recommend any screening for nonpregnant women who are considered low risk for pelvic cancer and who do not have symptoms. Ask your health care provider if a screening pelvic exam is right for you.  If you have had past treatment for cervical cancer or a condition that could lead to cancer, you need Pap tests and screening for cancer for at least 20 years after your treatment. If Pap tests have been discontinued, your risk factors (such as having a new sexual partner) need to be reassessed to determine if screening should resume. Some women have medical problems that increase the chance of getting cervical cancer. In these cases, your health care provider may recommend more frequent screening and Pap tests. Colorectal Cancer  This type of cancer can be detected and often prevented.  Routine colorectal cancer screening usually begins at 25 years of age and continues through 25 years of age.  Your health care provider may recommend screening at an earlier age if you have risk factors for colon cancer.  Your health care provider may also recommend using home test kits to check for hidden blood in the stool.  A small camera at the end of a tube can be used to examine your colon directly (sigmoidoscopy or colonoscopy). This is done to check for the earliest forms of colorectal  cancer.  Routine screening usually begins at age 50.  Direct examination of the colon should be repeated every 5-10 years through 25 years of age. However, you may need to be screened more often if early forms of precancerous polyps or small growths are found. Skin Cancer  Check your skin from head to toe regularly.  Tell your health care provider about any new moles or changes in moles, especially if there is a change in a mole's shape or color.  Also tell your health care provider if you have a mole that is larger than the size of a pencil eraser.  Always use sunscreen. Apply sunscreen liberally and repeatedly throughout the day.  Protect yourself by wearing long sleeves, pants, a wide-brimmed hat, and sunglasses whenever you are outside. HEART DISEASE, DIABETES, AND HIGH BLOOD PRESSURE   High blood pressure causes heart disease and increases the risk of stroke. High blood pressure is more likely to develop in:  People who have blood pressure in the high end   of the normal range (130-139/85-89 mm Hg).  People who are overweight or obese.  People who are African American.  If you are 38-23 years of age, have your blood pressure checked every 3-5 years. If you are 61 years of age or older, have your blood pressure checked every year. You should have your blood pressure measured twice--once when you are at a hospital or clinic, and once when you are not at a hospital or clinic. Record the average of the two measurements. To check your blood pressure when you are not at a hospital or clinic, you can use:  An automated blood pressure machine at a pharmacy.  A home blood pressure monitor.  If you are between 45 years and 39 years old, ask your health care provider if you should take aspirin to prevent strokes.  Have regular diabetes screenings. This involves taking a blood sample to check your fasting blood sugar level.  If you are at a normal weight and have a low risk for diabetes,  have this test once every three years after 25 years of age.  If you are overweight and have a high risk for diabetes, consider being tested at a younger age or more often. PREVENTING INFECTION  Hepatitis B  If you have a higher risk for hepatitis B, you should be screened for this virus. You are considered at high risk for hepatitis B if:  You were born in a country where hepatitis B is common. Ask your health care provider which countries are considered high risk.  Your parents were born in a high-risk country, and you have not been immunized against hepatitis B (hepatitis B vaccine).  You have HIV or AIDS.  You use needles to inject street drugs.  You live with someone who has hepatitis B.  You have had sex with someone who has hepatitis B.  You get hemodialysis treatment.  You take certain medicines for conditions, including cancer, organ transplantation, and autoimmune conditions. Hepatitis C  Blood testing is recommended for:  Everyone born from [birth year] through 1965.  Anyone with known risk factors for hepatitis C. Sexually transmitted infections (STIs)  You should be screened for sexually transmitted infections (STIs) including gonorrhea and chlamydia if:  You are sexually active and are younger than 24 years of age.  You are older than 25 years of age and your health care provider tells you that you are at risk for this type of infection.  Your sexual activity has changed since you were last screened and you are at an increased risk for chlamydia or gonorrhea. Ask your health care provider if you are at risk.  If you do not have HIV, but are at risk, it may be recommended that you take a prescription medicine daily to prevent HIV infection. This is called pre-exposure prophylaxis (PrEP). You are considered at risk if:  You are sexually active and do not regularly use condoms or know the HIV status of your partner(s).  You take drugs by injection.  You are sexually  active with a partner who has HIV. Talk with your health care provider about whether you are at high risk of being infected with HIV. If you choose to begin PrEP, you should first be tested for HIV. You should then be tested every 3 months for as long as you are taking PrEP.  PREGNANCY   If you are premenopausal and you may become pregnant, ask your health care provider about preconception counseling.  If you may  become pregnant, take 400 to 800 micrograms (mcg) of folic acid every day.  If you want to prevent pregnancy, talk to your health care provider about birth control (contraception). OSTEOPOROSIS AND MENOPAUSE   Osteoporosis is a disease in which the bones lose minerals and strength with aging. This can result in serious bone fractures. Your risk for osteoporosis can be identified using a bone density scan.  If you are 61 years of age or older, or if you are at risk for osteoporosis and fractures, ask your health care provider if you should be screened.  Ask your health care provider whether you should take a calcium or vitamin D supplement to lower your risk for osteoporosis.  Menopause may have certain physical symptoms and risks.  Hormone replacement therapy may reduce some of these symptoms and risks. Talk to your health care provider about whether hormone replacement therapy is right for you.  HOME CARE INSTRUCTIONS   Schedule regular health, dental, and eye exams.  Stay current with your immunizations.   Do not use any tobacco products including cigarettes, chewing tobacco, or electronic cigarettes.  If you are pregnant, do not drink alcohol.  If you are breastfeeding, limit how much and how often you drink alcohol.  Limit alcohol intake to no more than 1 drink per day for nonpregnant women. One drink equals 12 ounces of beer, 5 ounces of wine, or 1 ounces of hard liquor.  Do not use street drugs.  Do not share needles.  Ask your health care provider for help if  you need support or information about quitting drugs.  Tell your health care provider if you often feel depressed.  Tell your health care provider if you have ever been abused or do not feel safe at home.   This information is not intended to replace advice given to you by your health care provider. Make sure you discuss any questions you have with your health care provider.   Document Released: 08/15/2010 Document Revised: 02/20/2014 Document Reviewed: 01/01/2013 Elsevier Interactive Patient Education [employer].

## 2015-09-11 LAB — VITAMIN D 25 HYDROXY (VIT D DEFICIENCY, FRACTURES): Vit D, 25-Hydroxy: 50 ng/mL (ref 30–100)

## 2015-09-13 LAB — IPS PAP TEST WITH REFLEX TO HPV

## 2016-09-15 ENCOUNTER — Ambulatory Visit: Payer: Commercial Managed Care - HMO | Admitting: Obstetrics and Gynecology

## 2016-10-05 ENCOUNTER — Ambulatory Visit (INDEPENDENT_AMBULATORY_CARE_PROVIDER_SITE_OTHER): Payer: BLUE CROSS/BLUE SHIELD | Admitting: Obstetrics and Gynecology

## 2016-10-05 ENCOUNTER — Encounter: Payer: Self-pay | Admitting: Obstetrics and Gynecology

## 2016-10-05 VITALS — BP 118/70 | HR 70 | Ht 64.75 in | Wt 120.4 lb

## 2016-10-05 DIAGNOSIS — N912 Amenorrhea, unspecified: Secondary | ICD-10-CM

## 2016-10-05 LAB — POCT URINE PREGNANCY: Preg Test, Ur: POSITIVE — AB

## 2016-10-05 NOTE — Progress Notes (Signed)
GYNECOLOGY  VISIT   HPI: 26 y.o.   Single  Caucasian  female   G0P0 with Patient's last menstrual period was 08/19/2016 (approximate).   here for pregnancy confirmation. Patient had positive home pregnancy test x3. She is unsure of LMP.  Partner is present with her today.   Menses normal in July.  No bleeding or spotting.   One episode of morning sickness. Breast tenderness, fatigue, and urinary frequency.  UPT: Positive  GYNECOLOGIC HISTORY: Patient's last menstrual period was 08/19/2016 (approximate). Contraception:  none  Menopausal hormone therapy:  n/a Last mammogram:  n/a Last pap smear: 09-10-15 Neg                             06-13-12 Neg        OB History    Gravida Para Term Preterm AB Living   1             SAB TAB Ectopic Multiple Live Births                     There are no active problems to display for this patient.   History reviewed. No pertinent past medical history.  Past Surgical History:  Procedure Laterality Date  . WISDOM TOOTH EXTRACTION      No current outpatient prescriptions on file.   No current facility-administered medications for this visit.      ALLERGIES: Patient has no known allergies.  Family History  Problem Relation Age of Onset  . Breast cancer Mother 58  . Hypertension Mother     Social History   Social History  . Marital status: Single    Spouse name: N/A  . Number of children: N/A  . Years of education: N/A   Occupational History  . Not on file.   Social History Main Topics  . Smoking status: Never Smoker  . Smokeless tobacco: Never Used  . Alcohol use No  . Drug use: No  . Sexual activity: Yes    Partners: Male   Other Topics Concern  . Not on file   Social History Narrative  . No narrative on file    ROS:  Pertinent items are noted in HPI.  PHYSICAL EXAMINATION:    BP 118/70 (BP Location: Right Arm, Patient Position: Sitting, Cuff Size: Small)   Pulse 70   Ht 5' 4.75" (1.645 m)   Wt 120 lb  6.4 oz (54.6 kg)   LMP 08/19/2016 (Approximate)   BMI 20.19 kg/m     General appearance: alert, cooperative and appears stated age   Pelvic: External genitalia:  no lesions              Urethra:  normal appearing urethra with no masses, tenderness or lesions              Bartholins and Skenes: normal                 Vagina: normal appearing vagina with normal color and discharge, no lesions              Cervix: no lesions                Bimanual Exam:  Uterus:   6 - 7 week size, nontender.              Adnexa: no mass, fullness, tenderness             Chaperone  was present for exam.  ASSESSMENT  Positive urine pregnancy test.  6 + 6 weeks.  PLAN  Start PNV.  Discussed general early pregnancy guidelines for does and don'ts.  Discussed avoidance of exposures.  She will establish care with OB office and do [name] there.  List of providers to patient.  If she is not able to get in in a timely fashion, she will at least have her viability [name] here.     An After Visit Summary was printed and given to the patient.  _25_____ minutes face to face time of which over 50% was spent in counseling.

## 2016-10-13 ENCOUNTER — Other Ambulatory Visit: Payer: Self-pay | Admitting: Obstetrics and Gynecology

## 2016-10-13 DIAGNOSIS — O3680X0 Pregnancy with inconclusive fetal viability, not applicable or unspecified: Secondary | ICD-10-CM

## 2016-10-17 ENCOUNTER — Ambulatory Visit (INDEPENDENT_AMBULATORY_CARE_PROVIDER_SITE_OTHER): Payer: BLUE CROSS/BLUE SHIELD

## 2016-10-17 ENCOUNTER — Other Ambulatory Visit: Payer: Self-pay | Admitting: Obstetrics and Gynecology

## 2016-10-17 DIAGNOSIS — N83201 Unspecified ovarian cyst, right side: Secondary | ICD-10-CM

## 2016-10-17 DIAGNOSIS — O3680X0 Pregnancy with inconclusive fetal viability, not applicable or unspecified: Secondary | ICD-10-CM

## 2016-10-17 DIAGNOSIS — Z3A08 8 weeks gestation of pregnancy: Secondary | ICD-10-CM | POA: Diagnosis not present

## 2016-10-17 NOTE — Progress Notes (Signed)
US 8+3 wks,single IUP w/ys,positive fht 176 bpm,crl 19.14 mm,normal left ovary,complex right ovarian cyst w/low level echos,? endometrioma 6.5 x 6.3 x 6.4 cm, arterial and venous flow noted,EDD 05/26/2017 by LMP

## 2016-10-27 ENCOUNTER — Encounter: Payer: BLUE CROSS/BLUE SHIELD | Admitting: Women's Health

## 2016-10-27 ENCOUNTER — Ambulatory Visit: Payer: BLUE CROSS/BLUE SHIELD | Admitting: *Deleted

## 2016-11-16 ENCOUNTER — Ambulatory Visit: Payer: BLUE CROSS/BLUE SHIELD | Admitting: *Deleted

## 2016-11-16 ENCOUNTER — Encounter: Payer: Self-pay | Admitting: Advanced Practice Midwife

## 2016-11-16 ENCOUNTER — Ambulatory Visit (INDEPENDENT_AMBULATORY_CARE_PROVIDER_SITE_OTHER): Payer: BLUE CROSS/BLUE SHIELD | Admitting: Advanced Practice Midwife

## 2016-11-16 VITALS — BP 110/58 | HR 86 | Wt 119.0 lb

## 2016-11-16 DIAGNOSIS — Z331 Pregnant state, incidental: Secondary | ICD-10-CM

## 2016-11-16 DIAGNOSIS — Z3400 Encounter for supervision of normal first pregnancy, unspecified trimester: Secondary | ICD-10-CM | POA: Insufficient documentation

## 2016-11-16 DIAGNOSIS — Z3401 Encounter for supervision of normal first pregnancy, first trimester: Secondary | ICD-10-CM

## 2016-11-16 DIAGNOSIS — Z3A12 12 weeks gestation of pregnancy: Secondary | ICD-10-CM

## 2016-11-16 DIAGNOSIS — Z1389 Encounter for screening for other disorder: Secondary | ICD-10-CM

## 2016-11-16 DIAGNOSIS — Z3682 Encounter for antenatal screening for nuchal translucency: Secondary | ICD-10-CM

## 2016-11-16 LAB — POCT URINALYSIS DIPSTICK
GLUCOSE UA: NEGATIVE
Ketones, UA: NEGATIVE
LEUKOCYTES UA: NEGATIVE
NITRITE UA: NEGATIVE
Protein, UA: NEGATIVE
RBC UA: NEGATIVE

## 2016-11-16 MED ORDER — PRENATE MINI 18-0.6-0.4-350 MG PO CAPS: 1.0000 | ORAL_CAPSULE | Freq: Every day | ORAL | 11 refills | Status: DC

## 2016-11-16 NOTE — Progress Notes (Signed)
  Subjective:    Brittney Collins is a G1P0 [redacted]w[redacted]d being seen today for her first obstetrical visit.  Her obstetrical history is significant for first pregnancy.  Pregnancy history fully reviewed.  Patient reports no complaints.  Vitals:   11/16/16 1516  BP: (!) 110/58  Pulse: 86  Weight: 119 lb (54 kg)    HISTORY: OB History  Gravida Para Term Preterm AB Living  1            SAB TAB Ectopic Multiple Live Births               # Outcome Date GA Lbr Len/2nd Weight Sex Delivery Anes PTL Lv  1 Current              History reviewed. No pertinent past medical history. Past Surgical History:  Procedure Laterality Date  . WISDOM TOOTH EXTRACTION     Family History  Problem Relation Age of Onset  . Breast cancer Mother 59  . Hypertension Mother      Exam       Pelvic Exam:    Perineum: Normal Perineum   Vulva: normal   Vagina:  normal mucosa, normal discharge, no palpable nodules   Uterus Normal, Gravid, FH: 12     Cervix: normal   Adnexa: Not palpable   Urinary:  urethral meatus normal    System:     Skin: normal coloration and turgor, no rashes    Neurologic: oriented, normal, normal mood   Extremities: normal strength, tone, and muscle mass   HEENT PERRLA   Mouth/Teeth mucous membranes moist, normal dentition   Neck supple and no masses   Cardiovascular: regular rate and rhythm   Respiratory:  appears well, vitals normal, no respiratory distress, acyanotic   Abdomen: soft, non-tender;  FHR: 160 [name]        The nature of [place] [facility] with multiple MDs and other Advanced Practice Providers was explained to patient; also emphasized that residents, students are part of our team.  Assessment:    Pregnancy: G1P0 Patient Active Problem List   Diagnosis Date Noted  . Supervision of normal first pregnancy, antepartum 11/16/2016        Plan:     Initial labs drawn. Continue prenatal vitamins  Problem list reviewed  and updated  Reviewed n/v relief measures and warning s/s to report  Reviewed recommended weight gain based on pre-gravid BMI  Encouraged well-balanced diet Genetic Screening discussed Integrated Screen: requested.  Ultrasound discussed; fetal survey: requested.  Return for asap for NT only; 4 weeks for LROB.  CRESENZO-DISHMAN,Britton Bera 11/16/2016

## 2016-11-17 LAB — URINALYSIS, ROUTINE W REFLEX MICROSCOPIC
Bilirubin, UA: NEGATIVE
Glucose, UA: NEGATIVE
Ketones, UA: NEGATIVE
Nitrite, UA: NEGATIVE
Protein, UA: NEGATIVE
RBC, UA: NEGATIVE
Specific Gravity, UA: 1.014 (ref 1.005–1.030)
Urobilinogen, Ur: 0.2 mg/dL (ref 0.2–1.0)
pH, UA: 8 — ABNORMAL HIGH (ref 5.0–7.5)

## 2016-11-17 LAB — CBC
Hematocrit: 41.1 % (ref 34.0–46.6)
Hemoglobin: 13.9 g/dL (ref 11.1–15.9)
MCH: 31.4 pg (ref 26.6–33.0)
MCHC: 33.8 g/dL (ref 31.5–35.7)
MCV: 93 fL (ref 79–97)
Platelets: 326 10*3/uL (ref 150–379)
RBC: 4.42 x10E6/uL (ref 3.77–5.28)
RDW: 13.5 % (ref 12.3–15.4)
WBC: 13.4 10*3/uL — ABNORMAL HIGH (ref 3.4–10.8)

## 2016-11-17 LAB — HEPATITIS B SURFACE ANTIGEN: Hepatitis B Surface Ag: NEGATIVE

## 2016-11-17 LAB — ANTIBODY SCREEN: Antibody Screen: NEGATIVE

## 2016-11-17 LAB — HIV ANTIBODY (ROUTINE TESTING W REFLEX): HIV Screen 4th Generation wRfx: NONREACTIVE

## 2016-11-17 LAB — ABO/RH: Rh Factor: POSITIVE

## 2016-11-17 LAB — MICROSCOPIC EXAMINATION: CASTS: NONE SEEN /LPF

## 2016-11-17 LAB — RPR: RPR Ser Ql: NONREACTIVE

## 2016-11-17 LAB — VARICELLA ZOSTER ANTIBODY, IGG: Varicella zoster IgG: 706 index (ref >165)

## 2016-11-17 LAB — RUBELLA SCREEN: Rubella Antibodies, IGG: 5.26 index (ref >0.99)

## 2016-11-17 LAB — SICKLE CELL SCREEN: Sickle Cell Screen: NEGATIVE

## 2016-11-18 LAB — URINE CULTURE: Organism ID, Bacteria: NO GROWTH

## 2016-11-19 LAB — GC/CHLAMYDIA PROBE AMP
Chlamydia trachomatis, NAA: NEGATIVE
Neisseria gonorrhoeae by PCR: NEGATIVE

## 2016-11-20 ENCOUNTER — Ambulatory Visit (INDEPENDENT_AMBULATORY_CARE_PROVIDER_SITE_OTHER): Payer: BLUE CROSS/BLUE SHIELD

## 2016-11-20 ENCOUNTER — Other Ambulatory Visit: Payer: Self-pay | Admitting: Advanced Practice Midwife

## 2016-11-20 ENCOUNTER — Other Ambulatory Visit (INDEPENDENT_AMBULATORY_CARE_PROVIDER_SITE_OTHER): Payer: BLUE CROSS/BLUE SHIELD

## 2016-11-20 ENCOUNTER — Ambulatory Visit: Payer: Commercial Managed Care - HMO | Admitting: Obstetrics and Gynecology

## 2016-11-20 DIAGNOSIS — N83201 Unspecified ovarian cyst, right side: Secondary | ICD-10-CM

## 2016-11-20 DIAGNOSIS — Z3400 Encounter for supervision of normal first pregnancy, unspecified trimester: Secondary | ICD-10-CM

## 2016-11-20 DIAGNOSIS — Z23 Encounter for immunization: Secondary | ICD-10-CM | POA: Diagnosis not present

## 2016-11-20 DIAGNOSIS — Z3682 Encounter for antenatal screening for nuchal translucency: Secondary | ICD-10-CM

## 2016-11-20 NOTE — Progress Notes (Signed)
[name] 13+2 wks,measurement c/w dates,crl 68.10 mm,NB present,NT 1.3 mm,normal left ovary,right ovarian cyst w/low level echoes 7.3 x 4.4 x 6.9 cm (slightly larger) arterial and venous flow visualized,fhr 153 bpm

## 2016-11-21 ENCOUNTER — Encounter: Payer: Self-pay | Admitting: Advanced Practice Midwife

## 2016-11-21 MED ORDER — PRENATE PIXIE 10-0.6-0.4-200 MG PO CAPS: 1.0000 | ORAL_CAPSULE | Freq: Every day | ORAL | 11 refills | Status: DC

## 2016-11-23 LAB — INTEGRATED 1
Crown Rump Length: 68.1 mm
GEST. AGE ON COLLECTION DATE: 12.9 wk
Maternal Age at EDD: 26.7 yr
NUCHAL TRANSLUCENCY (NT): 1.3 mm
Number of Fetuses: 1
PAPP-A Value: 1896.5 ng/mL
Weight: 119 [lb_av]

## 2016-12-14 ENCOUNTER — Ambulatory Visit (INDEPENDENT_AMBULATORY_CARE_PROVIDER_SITE_OTHER): Payer: BLUE CROSS/BLUE SHIELD | Admitting: Advanced Practice Midwife

## 2016-12-14 ENCOUNTER — Encounter: Payer: Self-pay | Admitting: Advanced Practice Midwife

## 2016-12-14 VITALS — BP 100/60 | Wt 122.0 lb

## 2016-12-14 DIAGNOSIS — Z1379 Encounter for other screening for genetic and chromosomal anomalies: Secondary | ICD-10-CM

## 2016-12-14 DIAGNOSIS — Z331 Pregnant state, incidental: Secondary | ICD-10-CM

## 2016-12-14 DIAGNOSIS — Z1389 Encounter for screening for other disorder: Secondary | ICD-10-CM

## 2016-12-14 DIAGNOSIS — Z3400 Encounter for supervision of normal first pregnancy, unspecified trimester: Secondary | ICD-10-CM

## 2016-12-14 DIAGNOSIS — Z3A16 16 weeks gestation of pregnancy: Secondary | ICD-10-CM

## 2016-12-14 DIAGNOSIS — Z3402 Encounter for supervision of normal first pregnancy, second trimester: Secondary | ICD-10-CM

## 2016-12-14 DIAGNOSIS — Z363 Encounter for antenatal screening for malformations: Secondary | ICD-10-CM

## 2016-12-14 LAB — POCT URINALYSIS DIPSTICK
GLUCOSE UA: NEGATIVE
KETONES UA: NEGATIVE
Leukocytes, UA: NEGATIVE
Nitrite, UA: NEGATIVE
Protein, UA: NEGATIVE
RBC UA: NEGATIVE

## 2016-12-14 NOTE — Addendum Note (Signed)
Addended by: Federico FlakeNES, PEGGY A on: 12/14/2016 10:05 AM   Modules accepted: Orders

## 2016-12-14 NOTE — Progress Notes (Signed)
G1P0 6038w5d Estimated Date of Delivery: 05/26/17  Blood pressure 100/60, weight 122 lb (55.3 kg), last menstrual period 08/19/2016.   BP weight and urine results all reviewed and noted.  Please refer to the obstetrical flow sheet for the fundal height and fetal heart rate documentation:  Patient denies any bleeding and no rupture of membranes symptoms or regular contractions. Patient is without complaints. All questions were answered.  Orders Placed This Encounter  Procedures  . US OB Comp + 14 Wk  . INTEGRATED 2  . POCT urinalysis dipstick    Plan:  Continued routine obstetrical care, 2nd IT  Return in about 3 weeks (around 01/04/2017) for LROB, XB:[id number]:Anatomy.

## 2016-12-14 NOTE — Patient Instructions (Signed)

## 2016-12-19 LAB — INTEGRATED 2
AFP MARKER: 32.7 ng/mL
AFP MOM: 0.9
Crown Rump Length: 68.1 mm
DIA MoM: 2.65
DIA Value: 506.5 pg/mL
Estriol, Unconjugated: 1.57 ng/mL
GEST. AGE ON COLLECTION DATE: 12.9 wk
Gestational Age: 16.3 weeks
HCG VALUE: 62.8 [IU]/mL
MATERNAL AGE AT EDD: 26.7 a
NUMBER OF FETUSES: 1
Nuchal Translucency (NT): 1.3 mm
Nuchal Translucency MoM: 0.77
PAPP-A MOM: 1.36
PAPP-A VALUE: 1896.5 ng/mL
Test Results:: NEGATIVE
WEIGHT: 122 [lb_av]
Weight: 119 [lb_av]
hCG MoM: 1.76
uE3 MoM: 1.85

## 2017-01-09 ENCOUNTER — Other Ambulatory Visit: Payer: Self-pay

## 2017-01-09 ENCOUNTER — Ambulatory Visit (INDEPENDENT_AMBULATORY_CARE_PROVIDER_SITE_OTHER): Payer: BLUE CROSS/BLUE SHIELD

## 2017-01-09 ENCOUNTER — Ambulatory Visit (INDEPENDENT_AMBULATORY_CARE_PROVIDER_SITE_OTHER): Payer: BLUE CROSS/BLUE SHIELD | Admitting: Obstetrics & Gynecology

## 2017-01-09 ENCOUNTER — Encounter: Payer: Self-pay | Admitting: Obstetrics & Gynecology

## 2017-01-09 VITALS — BP 114/66 | HR 79 | Wt 127.0 lb

## 2017-01-09 DIAGNOSIS — Z3402 Encounter for supervision of normal first pregnancy, second trimester: Secondary | ICD-10-CM

## 2017-01-09 DIAGNOSIS — Z363 Encounter for antenatal screening for malformations: Secondary | ICD-10-CM | POA: Diagnosis not present

## 2017-01-09 DIAGNOSIS — Z331 Pregnant state, incidental: Secondary | ICD-10-CM

## 2017-01-09 DIAGNOSIS — Z3A20 20 weeks gestation of pregnancy: Secondary | ICD-10-CM

## 2017-01-09 DIAGNOSIS — Z3400 Encounter for supervision of normal first pregnancy, unspecified trimester: Secondary | ICD-10-CM

## 2017-01-09 DIAGNOSIS — Z1389 Encounter for screening for other disorder: Secondary | ICD-10-CM

## 2017-01-09 LAB — POCT URINALYSIS DIPSTICK
Glucose, UA: NEGATIVE
KETONES UA: NEGATIVE
Leukocytes, UA: NEGATIVE
Nitrite, UA: NEGATIVE
PROTEIN UA: NEGATIVE
RBC UA: NEGATIVE

## 2017-01-09 NOTE — Progress Notes (Signed)
US 20+3 wks,cephalic/breech, cx 4.3 cm,normal left ovaries,right ovarian cyst w/low level echoes 7.5 x 4.5 x 5.7 cm N/C,post fundal pl gr 0,efw 380 g,fhr 145 bpm,svp 4.9 cm,anatomy complete,no obvious abnormalities

## 2017-01-09 NOTE — Progress Notes (Signed)
G1P0 1220w3d Estimated Date of Delivery: 05/26/17  Blood pressure 114/66, pulse 79, weight 127 lb (57.6 kg), last menstrual period 08/19/2016.   BP weight and urine results all reviewed and noted.  Please refer to the obstetrical flow sheet for the fundal height and fetal heart rate documentation:  Patient reports good fetal movement, denies any bleeding and no rupture of membranes symptoms or regular contractions. Patient is without complaints. All questions were answered.  Orders Placed This Encounter  Procedures  . POCT urinalysis dipstick    Plan:  Continued routine obstetrical care, sonogram is normal, see report  Return in about 4 weeks (around 02/06/2017) for LROB.

## 2017-01-28 ENCOUNTER — Encounter: Payer: Self-pay | Admitting: Advanced Practice Midwife

## 2017-02-08 ENCOUNTER — Encounter: Payer: Self-pay | Admitting: Women's Health

## 2017-02-08 ENCOUNTER — Ambulatory Visit (INDEPENDENT_AMBULATORY_CARE_PROVIDER_SITE_OTHER): Payer: BLUE CROSS/BLUE SHIELD | Admitting: Women's Health

## 2017-02-08 VITALS — BP 110/60 | HR 97 | Wt 135.4 lb

## 2017-02-08 DIAGNOSIS — Z1389 Encounter for screening for other disorder: Secondary | ICD-10-CM

## 2017-02-08 DIAGNOSIS — Z3A24 24 weeks gestation of pregnancy: Secondary | ICD-10-CM

## 2017-02-08 DIAGNOSIS — Z331 Pregnant state, incidental: Secondary | ICD-10-CM

## 2017-02-08 DIAGNOSIS — O288 Other abnormal findings on antenatal screening of mother: Secondary | ICD-10-CM

## 2017-02-08 DIAGNOSIS — Z3400 Encounter for supervision of normal first pregnancy, unspecified trimester: Secondary | ICD-10-CM

## 2017-02-08 DIAGNOSIS — Z3402 Encounter for supervision of normal first pregnancy, second trimester: Secondary | ICD-10-CM

## 2017-02-08 DIAGNOSIS — N83201 Unspecified ovarian cyst, right side: Secondary | ICD-10-CM | POA: Insufficient documentation

## 2017-02-08 LAB — POCT URINALYSIS DIPSTICK
Blood, UA: NEGATIVE
GLUCOSE UA: NEGATIVE
KETONES UA: NEGATIVE
LEUKOCYTES UA: NEGATIVE
Nitrite, UA: NEGATIVE
Protein, UA: NEGATIVE

## 2017-02-08 NOTE — Patient Instructions (Signed)
Brittney Collins, I greatly value your feedback.  If you receive a survey following your visit with us today, we appreciate you taking the time to fill it out.  Thanks, Brittney Collins, CNM, WHNP-BC   You will have your sugar test next visit.  Please do not eat or drink anything after midnight the night before you come, not even water.  You will be here for at least two hours.     Call the office [phone]) or go to [facility] if:  You begin to have strong, frequent contractions  Your water breaks.  Sometimes it is a big gush of fluid, sometimes it is just a trickle that keeps getting your panties wet or running down your legs  You have vaginal bleeding.  It is normal to have a small amount of spotting if your cervix was checked.   You don't feel your baby moving like normal.  If you don't, get you something to eat and drink and lay down and focus on feeling your baby move.   If your baby is still not moving like normal, you should call the office or go to [facility].  Second Trimester of Pregnancy The second trimester is from week 13 through week 28, months 4 through 6. The second trimester is often a time when you feel your best. Your body has also adjusted to being pregnant, and you begin to feel better physically. Usually, morning sickness has lessened or quit completely, you may have more energy, and you may have an increase in appetite. The second trimester is also a time when the fetus is growing rapidly. At the end of the sixth month, the fetus is about 9 inches long and weighs about 1 pounds. You will likely begin to feel the baby move (quickening) between 18 and 20 weeks of the pregnancy. BODY CHANGES Your body goes through many changes during pregnancy. The changes vary from woman to woman.   Your weight will continue to increase. You will notice your lower abdomen bulging out.  You may begin to get stretch marks on your hips, abdomen, and breasts.  You may develop  headaches that can be relieved by medicines approved by your health care provider.  You may urinate more often because the fetus is pressing on your bladder.  You may develop or continue to have heartburn as a result of your pregnancy.  You may develop constipation because certain hormones are causing the muscles that push waste through your intestines to slow down.  You may develop hemorrhoids or swollen, bulging veins (varicose veins).  You may have back pain because of the weight gain and pregnancy hormones relaxing your joints between the bones in your pelvis and as a result of a shift in weight and the muscles that support your balance.  Your breasts will continue to grow and be tender.  Your gums may bleed and may be sensitive to brushing and flossing.  Dark spots or blotches (chloasma, mask of pregnancy) may develop on your face. This will likely fade after the baby is born.  A dark line from your belly button to the pubic area (linea nigra) may appear. This will likely fade after the baby is born.  You may have changes in your hair. These can include thickening of your hair, rapid growth, and changes in texture. Some women also have hair loss during or after pregnancy, or hair that feels dry or thin. Your hair will most likely return to normal after your  baby is born. WHAT TO EXPECT AT YOUR PRENATAL VISITS During a routine prenatal visit:  You will be weighed to make sure you and the fetus are growing normally.  Your blood pressure will be taken.  Your abdomen will be measured to track your baby's growth.  The fetal heartbeat will be listened to.  Any test results from the previous visit will be discussed. Your health care provider may ask you:  How you are feeling.  If you are feeling the baby move.  If you have had any abnormal symptoms, such as leaking fluid, bleeding, severe headaches, or abdominal cramping.  If you have any questions. Other tests that may be  performed during your second trimester include:  Blood tests that check for:  Low iron levels (anemia).  Gestational diabetes (between 24 and 28 weeks).  Rh antibodies.  Urine tests to check for infections, diabetes, or protein in the urine.  An ultrasound to confirm the proper growth and development of the baby.  An amniocentesis to check for possible genetic problems.  Fetal screens for spina bifida and Down syndrome. HOME CARE INSTRUCTIONS   Avoid all smoking, herbs, alcohol, and unprescribed drugs. These chemicals affect the formation and growth of the baby.  Follow your health care provider's instructions regarding medicine use. There are medicines that are either safe or unsafe to take during pregnancy.  Exercise only as directed by your health care provider. Experiencing uterine cramps is a good sign to stop exercising.  Continue to eat regular, healthy meals.  Wear a good support bra for breast tenderness.  Do not use hot tubs, steam rooms, or saunas.  Wear your seat belt at all times when driving.  Avoid raw meat, uncooked cheese, cat litter boxes, and soil used by cats. These carry germs that can cause birth defects in the baby.  Take your prenatal vitamins.  Try taking a stool softener (if your health care provider approves) if you develop constipation. Eat more high-fiber foods, such as fresh vegetables or fruit and whole grains. Drink plenty of fluids to keep your urine clear or pale yellow.  Take warm sitz baths to soothe any pain or discomfort caused by hemorrhoids. Use hemorrhoid cream if your health care provider approves.  If you develop varicose veins, wear support hose. Elevate your feet for 15 minutes, 3-4 times a day. Limit salt in your diet.  Avoid heavy lifting, wear low heel shoes, and practice good posture.  Rest with your legs elevated if you have leg cramps or low back pain.  Visit your dentist if you have not gone yet during your pregnancy.  Use a soft toothbrush to brush your teeth and be gentle when you floss.  A sexual relationship may be continued unless your health care provider directs you otherwise.  Continue to go to all your prenatal visits as directed by your health care provider. SEEK MEDICAL CARE IF:   You have dizziness.  You have mild pelvic cramps, pelvic pressure, or nagging pain in the abdominal area.  You have persistent nausea, vomiting, or diarrhea.  You have a bad smelling vaginal discharge.  You have pain with urination. SEEK IMMEDIATE MEDICAL CARE IF:   You have a fever.  You are leaking fluid from your vagina.  You have spotting or bleeding from your vagina.  You have severe abdominal cramping or pain.  You have rapid weight gain or loss.  You have shortness of breath with chest pain.  You notice sudden or extreme   swelling of your face, hands, ankles, feet, or legs.  You have not felt your baby move in over an hour.  You have severe headaches that do not go away with medicine.  You have vision changes. Document Released: 01/24/2001 Document Revised: 02/04/2013 Document Reviewed: 04/02/2012 [facility] Patient Information 2015 [place], [place]. This information is not intended to replace advice given to you by your health care provider. Make sure you discuss any questions you have with your health care provider.

## 2017-02-08 NOTE — Progress Notes (Signed)
LOW-RISK PREGNANCY VISIT Patient name: Brittney Collins MRN [id number]  Date of birth: 12/03/1990 Chief Complaint:   Routine Prenatal Visit  History of Present Illness:   Brittney Collins is a 26 y.o. G1P0 female at [redacted]w[redacted]d with an Estimated Date of Delivery: 05/26/17 being seen today for ongoing management of a low-risk pregnancy.  Today she reports no complaints. Contractions: Not present.  .  Movement: Present. denies leaking of fluid. Review of Systems:   Pertinent items are noted in HPI Denies abnormal vaginal discharge w/ itching/odor/irritation, headaches, visual changes, shortness of breath, chest pain, abdominal pain, severe nausea/vomiting, or problems with urination or bowel movements unless otherwise stated above. Pertinent History Reviewed:  Reviewed past medical,surgical, social, obstetrical and family history.  Reviewed problem list, medications and allergies. Physical Assessment:   Vitals:   02/08/17 1543  BP: 110/60  Pulse: 97  Weight: 135 lb 6.4 oz (61.4 kg)  Body mass index is 22.71 kg/m.        Physical Examination:   General appearance: Well appearing, and in no distress  Mental status: Alert, oriented to person, place, and time  Skin: Warm & dry  Cardiovascular: Normal heart rate noted  Respiratory: Normal respiratory effort, no distress  Abdomen: Soft, gravid, nontender  Pelvic: Cervical exam deferred         Extremities: Edema: None  Fetal Status: Fetal Heart Rate (bpm): 150 Fundal Height: 22 cm Movement: Present    Results for orders placed or performed in visit on 02/08/17 (from the past 24 hour(s))  POCT urinalysis dipstick   Collection Time: 02/08/17  3:47 PM  Result Value Ref Range   Color, UA     Clarity, UA     Glucose, UA neg    Bilirubin, UA     Ketones, UA neg    Spec Grav, UA  1.010 - 1.025   Blood, UA neg    pH, UA  5.0 - 8.0   Protein, UA neg    Urobilinogen, UA  0.2 or 1.0 E.U./dL   Nitrite, UA neg    Leukocytes, UA  Negative Negative   Appearance     Odor      Assessment & Plan:  1) Low-risk pregnancy G1P0 at [redacted]w[redacted]d with an Estimated Date of Delivery: 05/26/17    Labs/procedures today: none  Plan:  Continue routine obstetrical care   Reviewed: Preterm labor symptoms and general obstetric precautions including but not limited to vaginal bleeding, contractions, leaking of fluid and fetal movement were reviewed in detail with the patient.  All questions were answered  Follow-up: Return in about 4 weeks (around 03/08/2017) for LROB, PN2.  Orders Placed This Encounter  Procedures  . POCT urinalysis dipstick   Marge Duncans CNM, [facility] 02/08/2017 4:12 PM

## 2017-02-09 ENCOUNTER — Encounter: Payer: BLUE CROSS/BLUE SHIELD | Admitting: Women's Health

## 2017-02-13 NOTE — L&D Delivery Note (Signed)
Delivery Note Pt pushed a total of 5 hours.  However, only about every other or every 3rd ctx seemed effective. Around 0630, Pitocin was started at 712mu/min with hopes of making all ctx equally effective. Pt brought baby down far enough for a vacuum, asked again for it and Dr. Vergie LivingPickens was asked to come.  By the time he arrived, pt had crowned up and the baby didn't backslide, so vacuum was no longer necessary. FHR had been in the 180's for about 15 minutes, at the time thought d/t baby being very active, but couldn't be sure until it returned to baseline, therefore waterbirth was not an option any longer. Pt was fine with this. At 6:51 AM a viable female was delivered via Vaginal, Spontaneous (Presentation:ROA  ). There was a compound hand/arm which was reduced, the other (posterior) arm was delivered, and still the abdomen was difficult to extract.  APGAR: 7, 8; weight 9 lb 2 oz (4140 g).  After 1 minute, the cord was clamped and cut. 40 units of pitocin diluted in 1000cc LR was infused rapidly IV.  The placenta separated spontaneously and delivered via CCT and maternal pushing effort.  It was inspected and appears to be intact with a 3 VC.  The vagina was inspected and a partial 3rd degree lac identified. Dr. Vergie LivingPickens called back again and performed the repair under local anesthesia .   Anesthesia:  local Episiotomy: None Lacerations: 3rd degree Suture Repair: see Dr. Tarri GlennPicken's note for repair Est. Blood Loss (mL):    Mom to postpartum.  Baby to Couplet care / Skin to Skin.  Brittney Collins 05/26/2017, 7:24 AM  Please schedule this patient for PP visit in: 4 weeks Low risk pregnancy complicated by:  Delivery mode:  SVD Anticipated Birth Control:  POPs PP Procedures needed:   Schedule Integrated BH visit: no Provider: Any provider

## 2017-03-05 ENCOUNTER — Ambulatory Visit (INDEPENDENT_AMBULATORY_CARE_PROVIDER_SITE_OTHER): Payer: BLUE CROSS/BLUE SHIELD | Admitting: Women's Health

## 2017-03-05 ENCOUNTER — Encounter: Payer: Self-pay | Admitting: Women's Health

## 2017-03-05 ENCOUNTER — Other Ambulatory Visit: Payer: BLUE CROSS/BLUE SHIELD

## 2017-03-05 VITALS — BP 110/84 | HR 78 | Wt 137.0 lb

## 2017-03-05 DIAGNOSIS — Z3A28 28 weeks gestation of pregnancy: Secondary | ICD-10-CM | POA: Diagnosis not present

## 2017-03-05 DIAGNOSIS — Z3400 Encounter for supervision of normal first pregnancy, unspecified trimester: Secondary | ICD-10-CM

## 2017-03-05 DIAGNOSIS — Z131 Encounter for screening for diabetes mellitus: Secondary | ICD-10-CM

## 2017-03-05 DIAGNOSIS — Z3403 Encounter for supervision of normal first pregnancy, third trimester: Secondary | ICD-10-CM

## 2017-03-05 DIAGNOSIS — Z1389 Encounter for screening for other disorder: Secondary | ICD-10-CM

## 2017-03-05 DIAGNOSIS — N83201 Unspecified ovarian cyst, right side: Secondary | ICD-10-CM

## 2017-03-05 DIAGNOSIS — Z331 Pregnant state, incidental: Secondary | ICD-10-CM

## 2017-03-05 LAB — POCT URINALYSIS DIPSTICK
GLUCOSE UA: NEGATIVE
Ketones, UA: NEGATIVE
Nitrite, UA: NEGATIVE
RBC UA: NEGATIVE

## 2017-03-05 NOTE — Progress Notes (Signed)
LOW-RISK PREGNANCY VISIT Patient name: Brittney Collins MRN [id number]  Date of birth: [birth year] Chief Complaint:   Routine Prenatal Visit (PN2)  History of Present Illness:   Brittney Collins is a 27 y.o. G1P0 female at [redacted]w[redacted]d with an Estimated Date of Delivery: 05/26/17 being seen today for ongoing management of a low-risk pregnancy.  Today she reports no complaints Interested in waterbirth. Contractions: Not present. Vag. Bleeding: None.  Movement: Present. denies leaking of fluid. Review of Systems:   Pertinent items are noted in HPI Denies abnormal vaginal discharge w/ itching/odor/irritation, headaches, visual changes, shortness of breath, chest pain, abdominal pain, severe nausea/vomiting, or problems with urination or bowel movements unless otherwise stated above. Pertinent History Reviewed:  Reviewed past medical,surgical, social, obstetrical and family history.  Reviewed problem list, medications and allergies. Physical Assessment:   Vitals:   03/05/17 0917  BP: 110/84  Pulse: 78  Weight: 137 lb (62.1 kg)  Body mass index is 22.97 kg/m.        Physical Examination:   General appearance: Well appearing, and in no distress  Mental status: Alert, oriented to person, place, and time  Skin: Warm & dry  Cardiovascular: Normal heart rate noted  Respiratory: Normal respiratory effort, no distress  Abdomen: Soft, gravid, nontender  Pelvic: Cervical exam deferred         Extremities: Edema: None  Fetal Status: Fetal Heart Rate (bpm): 136 Fundal Height: 27 cm Movement: Present    Results for orders placed or performed in visit on 03/05/17 (from the past 24 hour(s))  POCT urinalysis dipstick   Collection Time: 03/05/17  9:18 AM  Result Value Ref Range   Color, UA     Clarity, UA     Glucose, UA neg    Bilirubin, UA     Ketones, UA neg    Spec Grav, UA  1.010 - 1.025   Blood, UA neg    pH, UA  5.0 - 8.0   Protein, UA trace    Urobilinogen, UA  0.2 or 1.0  E.U./dL   Nitrite, UA neg    Leukocytes, UA Small (1+) (A) Negative   Appearance     Odor      Assessment & Plan:  1) Low-risk pregnancy G1P0 at [redacted]w[redacted]d with an Estimated Date of Delivery: 05/26/17   2) Interested in Systems developer, discussed, gave printed info, sign up for class   Meds: No orders of the defined types were placed in this encounter.  Labs/procedures today: pn2  Plan:  Continue routine obstetrical care   Reviewed: Preterm labor symptoms and general obstetric precautions including but not limited to vaginal bleeding, contractions, leaking of fluid and fetal movement were reviewed in detail with the patient.  Recommended Tdap at HD/PCP per CDC guidelines. All questions were answered  Follow-up: Return in about 4 weeks (around 04/02/2017) for LROB.  Orders Placed This Encounter  Procedures  . POCT urinalysis dipstick   Marge Duncans CNM, [facility] 03/05/2017 9:24 AM

## 2017-03-05 NOTE — Patient Instructions (Addendum)
Brittney Collins, I greatly value your feedback.  If you receive a survey following your visit with [name] today, we appreciate you taking the time to fill it out.  Thanks, [name], CNM, WHNP-BC   Call the office [phone]) or go to [facility] if:  You begin to have strong, frequent contractions  Your water breaks.  Sometimes it is a big gush of fluid, sometimes it is just a trickle that keeps getting your panties wet or running down your legs  You have vaginal bleeding.  It is normal to have a small amount of spotting if your cervix was checked.   You don't feel your baby moving like normal.  If you don't, get you something to eat and drink and lay down and focus on feeling your baby move.  You should feel at least 10 movements in 2 hours.  If you don't, you should call the office or go to [facility].    Tdap Vaccine  It is recommended that you get the Tdap vaccine during the third trimester of EACH pregnancy to help protect your baby from getting pertussis (whooping cough)  27-36 weeks is the BEST time to do this so that you can pass the protection on to your baby. During pregnancy is better than after pregnancy, but if you are unable to get it during pregnancy it will be offered at the hospital.   You can get this vaccine at the health department or your family doctor  Everyone who will be around your baby should also be up-to-date on their vaccines. Adults (who are not pregnant) only need 1 dose of Tdap during adulthood.   Third Trimester of Pregnancy The third trimester is from week 29 through week 42, months 7 through 9. The third trimester is a time when the fetus is growing rapidly. At the end of the ninth month, the fetus is about 20 inches in length and weighs 6-10 pounds.  BODY CHANGES Your body goes through many changes during pregnancy. The changes vary from woman to woman.   Your weight will continue to increase. You can expect to gain 25-35 pounds (11-16  kg) by the end of the pregnancy.  You may begin to get stretch marks on your hips, abdomen, and breasts.  You may urinate more often because the fetus is moving lower into your pelvis and pressing on your bladder.  You may develop or continue to have heartburn as a result of your pregnancy.  You may develop constipation because certain hormones are causing the muscles that push waste through your intestines to slow down.  You may develop hemorrhoids or swollen, bulging veins (varicose veins).  You may have pelvic pain because of the weight gain and pregnancy hormones relaxing your joints between the bones in your pelvis. Backaches may result from overexertion of the muscles supporting your posture.  You may have changes in your hair. These can include thickening of your hair, rapid growth, and changes in texture. Some women also have hair loss during or after pregnancy, or hair that feels dry or thin. Your hair will most likely return to normal after your baby is born.  Your breasts will continue to grow and be tender. A yellow discharge may leak from your breasts called colostrum.  Your belly button may stick out.  You may feel short of breath because of your expanding uterus.  You may notice the fetus "dropping," or moving lower in your abdomen.  You may have a bloody  mucus discharge. This usually occurs a few days to a week before labor begins.  Your cervix becomes thin and soft (effaced) near your due date. WHAT TO EXPECT AT YOUR PRENATAL EXAMS  You will have prenatal exams every 2 weeks until week 36. Then, you will have weekly prenatal exams. During a routine prenatal visit:  You will be weighed to make sure you and the fetus are growing normally.  Your blood pressure is taken.  Your abdomen will be measured to track your baby's growth.  The fetal heartbeat will be listened to.  Any test results from the previous visit will be discussed.  You may have a cervical check  near your due date to see if you have effaced. At around 36 weeks, your caregiver will check your cervix. At the same time, your caregiver will also perform a test on the secretions of the vaginal tissue. This test is to determine if a type of bacteria, Group B streptococcus, is present. Your caregiver will explain this further. Your caregiver may ask you:  What your birth plan is.  How you are feeling.  If you are feeling the baby move.  If you have had any abnormal symptoms, such as leaking fluid, bleeding, severe headaches, or abdominal cramping.  If you have any questions. Other tests or screenings that may be performed during your third trimester include:  Blood tests that check for low iron levels (anemia).  Fetal testing to check the health, activity level, and growth of the fetus. Testing is done if you have certain medical conditions or if there are problems during the pregnancy. FALSE LABOR You may feel small, irregular contractions that eventually go away. These are called Braxton Hicks contractions, or false labor. Contractions may last for hours, days, or even weeks before true labor sets in. If contractions come at regular intervals, intensify, or become painful, it is best to be seen by your caregiver.  SIGNS OF LABOR   Menstrual-like cramps.  Contractions that are 5 minutes apart or less.  Contractions that start on the top of the uterus and spread down to the lower abdomen and back.  A sense of increased pelvic pressure or back pain.  A watery or bloody mucus discharge that comes from the vagina. If you have any of these signs before the 37th week of pregnancy, call your caregiver right away. You need to go to the hospital to get checked immediately. HOME CARE INSTRUCTIONS   Avoid all smoking, herbs, alcohol, and unprescribed drugs. These chemicals affect the formation and growth of the baby.  Follow your caregiver's instructions regarding medicine use. There are  medicines that are either safe or unsafe to take during pregnancy.  Exercise only as directed by your caregiver. Experiencing uterine cramps is a good sign to stop exercising.  Continue to eat regular, healthy meals.  Wear a good support bra for breast tenderness.  Do not use hot tubs, steam rooms, or saunas.  Wear your seat belt at all times when driving.  Avoid raw meat, uncooked cheese, cat litter boxes, and soil used by cats. These carry germs that can cause birth defects in the baby.  Take your prenatal vitamins.  Try taking a stool softener (if your caregiver approves) if you develop constipation. Eat more high-fiber foods, such as fresh vegetables or fruit and whole grains. Drink plenty of fluids to keep your urine clear or pale yellow.  Take warm sitz baths to soothe any pain or discomfort caused by hemorrhoids.  Use hemorrhoid cream if your caregiver approves.  If you develop varicose veins, wear support hose. Elevate your feet for 15 minutes, 3-4 times a day. Limit salt in your diet.  Avoid heavy lifting, wear low heal shoes, and practice good posture.  Rest a lot with your legs elevated if you have leg cramps or low back pain.  Visit your dentist if you have not gone during your pregnancy. Use a soft toothbrush to brush your teeth and be gentle when you floss.  A sexual relationship may be continued unless your caregiver directs you otherwise.  Do not travel far distances unless it is absolutely necessary and only with the approval of your caregiver.  Take prenatal classes to understand, practice, and ask questions about the labor and delivery.  Make a trial run to the hospital.  Pack your hospital bag.  Prepare the baby's nursery.  Continue to go to all your prenatal visits as directed by your caregiver. SEEK MEDICAL CARE IF:  You are unsure if you are in labor or if your water has broken.  You have dizziness.  You have mild pelvic cramps, pelvic pressure, or  nagging pain in your abdominal area.  You have persistent nausea, vomiting, or diarrhea.  You have a bad smelling vaginal discharge.  You have pain with urination. SEEK IMMEDIATE MEDICAL CARE IF:   You have a fever.  You are leaking fluid from your vagina.  You have spotting or bleeding from your vagina.  You have severe abdominal cramping or pain.  You have rapid weight loss or gain.  You have shortness of breath with chest pain.  You notice sudden or extreme swelling of your face, hands, ankles, feet, or legs.  You have not felt your baby move in over an hour.  You have severe headaches that do not go away with medicine.  You have vision changes. Document Released: 01/24/2001 Document Revised: 02/04/2013 Document Reviewed: 04/02/2012 [facility] Patient Information 2015 [place], [place]. This information is not intended to replace advice given to you by your health care provider. Make sure you discuss any questions you have with your health care provider.  Thinking About Doren Custard???  Why consider waterbirth? . Gentle birth for babies . Less pain medicine used in labor . May allow for passive descent/less pushing . May reduce perineal tears  . More mobility and instinctive maternal position changes . Increased maternal relaxation . Reduced blood pressure in labor  Is waterbirth safe? What are the risks of infection, drowning or other complications? . Infection o Very low risk (3.7 % for tub vs 4.8% for bed) o 7 in 8000 waterbirths with documented infection o Poorly cleaned equipment most common cause o Slightly lower group B strep transmission rate  . Drowning o Maternal:  - Very low risk   - Related to seizures or fainting o Newborn:  - Very low risk. No evidence of increased risk of respiratory problems in multiple large studies - Physiological protection from breathing under water - Avoid underwater birth if there are any fetal complications - Once baby's  head is out of the water, keep it out.  . Birth complication o Some reports of cord trauma, but risk decreased by bringing baby to surface gradually o No evidence of increased risk of shoulder dystocia. Mothers can usually change positions faster in water than in a bed, possibly aiding the maneuvers to free the shoulder.  You must attend a Doren Custard class at [facility]  3rd Wednesday of every month  from 7-9pm  Free  [employer] by calling [phone] or online at VFederal.at  Bring [name] the certificate from the class  Waterbirth supplies needed for [facility] patients:  Our practice has a [employer] manager in a Box tub (Regular size) at the hospital that you can borrow  You will need to purchase an accessory kit that has all needed supplies through [employer] [phone] for kit, $65 for liner=$179+tax) or online through GotWeb[employer].is  Or you can purchase the supplies separately: o Single-use disposable tub liner for [employer] in a Box (REGULAR size) o New garden hose labeled "lead-free", "suitable for drinking water", "non-toxic" OR "water potable" o Garden hose to remove the dirty water o Electric drain pump to remove water (We recommend 792 gallon per hour or greater pump.)  o Fish net o Bathing suit top (optional) o Long-handled mirror (optional)  [employer]- sells EVERYTHING waterbirth related, accessory kits, tubs, etc  The [employer] (www.[facility]abor[facility].com) this is a great service if you don't want to be responsible for the set up/take down of tub! Just call the Labor Ladies and they will come do it for you for $200! This includes the rental fee for their tub, the accessory kit, set-up and take down   Things that would prevent you from having a waterbirth:  Premature, <37wks  Previous cesarean birth  Presence of thick meconium-stained fluid  Multiple gestation (Twins, triplets, etc.)  Uncontrolled diabetes  Hypertension  Heavy  vaginal bleeding  Non-reassuring fetal heart rate  Active infection (MRSA, etc.)  If your labor has to be induced  Other risk issues identified by your obstetrical provider

## 2017-03-05 NOTE — Addendum Note (Signed)
Addended by: Moss McRESENZO, Mavrik Bynum M on: 03/05/2017 08:34 AM   Modules accepted: Orders

## 2017-03-06 LAB — CBC
HEMATOCRIT: 36.2 % (ref 34.0–46.6)
Hemoglobin: 12.3 g/dL (ref 11.1–15.9)
MCH: 32.9 pg (ref 26.6–33.0)
MCHC: 34 g/dL (ref 31.5–35.7)
MCV: 97 fL (ref 79–97)
Platelets: 297 10*3/uL (ref 150–379)
RBC: 3.74 x10E6/uL — AB (ref 3.77–5.28)
RDW: 14.1 % (ref 12.3–15.4)
WBC: 9.8 10*3/uL (ref 3.4–10.8)

## 2017-03-06 LAB — RPR: RPR Ser Ql: NONREACTIVE

## 2017-03-06 LAB — ANTIBODY SCREEN: Antibody Screen: NEGATIVE

## 2017-03-06 LAB — GLUCOSE TOLERANCE, 2 HOURS W/ 1HR
GLUCOSE, 2 HOUR: 120 mg/dL (ref 65–152)
Glucose, 1 hour: 97 mg/dL (ref 65–179)
Glucose, Fasting: 71 mg/dL (ref 65–91)

## 2017-03-06 LAB — HIV ANTIBODY (ROUTINE TESTING W REFLEX): HIV SCREEN 4TH GENERATION: NONREACTIVE

## 2017-03-12 DIAGNOSIS — Z23 Encounter for immunization: Secondary | ICD-10-CM | POA: Diagnosis not present

## 2017-03-16 ENCOUNTER — Encounter: Payer: Self-pay | Admitting: Women's Health

## 2017-04-02 ENCOUNTER — Ambulatory Visit (INDEPENDENT_AMBULATORY_CARE_PROVIDER_SITE_OTHER): Payer: BLUE CROSS/BLUE SHIELD | Admitting: Obstetrics and Gynecology

## 2017-04-02 VITALS — BP 108/60 | HR 74 | Wt 142.6 lb

## 2017-04-02 DIAGNOSIS — Z3403 Encounter for supervision of normal first pregnancy, third trimester: Secondary | ICD-10-CM

## 2017-04-02 DIAGNOSIS — Z3A32 32 weeks gestation of pregnancy: Secondary | ICD-10-CM

## 2017-04-02 DIAGNOSIS — Z1389 Encounter for screening for other disorder: Secondary | ICD-10-CM

## 2017-04-02 DIAGNOSIS — Z331 Pregnant state, incidental: Secondary | ICD-10-CM

## 2017-04-02 LAB — POCT URINALYSIS DIPSTICK
Blood, UA: NEGATIVE
Glucose, UA: NEGATIVE
KETONES UA: NEGATIVE
Leukocytes, UA: NEGATIVE
NITRITE UA: NEGATIVE
PROTEIN UA: NEGATIVE

## 2017-04-02 NOTE — Progress Notes (Signed)
LOW-RISK PREGNANCY VISIT Patient name: Brittney Collins MRN [id number]  Date of birth: [birth year] Chief Complaint:   Routine Prenatal Visit  History of Present Illness:   Brittney Collins is a 27 y.o. G1P0 female at [redacted]w[redacted]d with an Estimated Date of Delivery: 05/26/17 being seen today for ongoing management of a low-risk pregnancy.  Today she reports no complaints. Contractions: Not present. Vag. Bleeding: None.  Movement: Present. denies leaking of fluid. Review of Systems:   Pertinent items are noted in HPI Denies abnormal vaginal discharge w/ itching/odor/irritation, headaches, visual changes, shortness of breath, chest pain, abdominal pain, severe nausea/vomiting, or problems with urination or bowel movements unless otherwise stated above. Pertinent History Reviewed:  Reviewed past medical,surgical, social, obstetrical and family history.  Reviewed problem list, medications and allergies. Physical Assessment:   Vitals:   04/02/17 1550  BP: 108/60  Pulse: 74  Weight: 142 lb 9.6 oz (64.7 kg)  Body mass index is 23.91 kg/m.        Physical Examination:   General appearance: Well appearing, and in no distress  Mental status: Alert, oriented to person, place, and time  Skin: Warm & dry  Cardiovascular: Normal heart rate noted  Respiratory: Normal respiratory effort, no distress  Abdomen: Soft, gravid, nontender  Pelvic: Cervical exam deferred         Extremities: Edema: None  Fetal Status: Fetal Heart Rate (bpm): 140 Fundal Height: 32 cm Movement: Present    Results for orders placed or performed in visit on 04/02/17 (from the past 24 hour(s))  POCT Urinalysis Dipstick   Collection Time: 04/02/17  3:53 PM  Result Value Ref Range   Color, UA     Clarity, UA     Glucose, UA neg    Bilirubin, UA     Ketones, UA neg    Spec Grav, UA  1.010 - 1.025   Blood, UA neg    pH, UA  5.0 - 8.0   Protein, UA neg    Urobilinogen, UA  0.2 or 1.0 E.U./dL   Nitrite, UA neg    Leukocytes, UA Negative Negative   Appearance     Odor      Assessment & Plan:  1) Low-risk pregnancy G1P0 at [redacted]w[redacted]d with an Estimated Date of Delivery: 05/26/17    Meds: No orders of the defined types were placed in this encounter.  Labs/procedures today:   Plan:  Continue routine obstetrical care  Reviewed: Preterm labor symptoms and general obstetric precautions including but not limited to vaginal bleeding, contractions, leaking of fluid and fetal movement were reviewed in detail with the patient.  All questions were answered  Follow-up: Return in about 2 weeks (around 04/16/2017), or if symptoms worsen or fail to improve, for LROB.  Orders Placed This Encounter  Procedures  . POCT Urinalysis Dipstick     By signing my name below, I, Izna Ahmed, attest that this documentation has been prepared under the direction and in the presence of Tilda Burrow, MD. Electronically Signed: Redge Gainer, Medical Scribe. 04/02/17. 4:05 PM.  I personally performed the services described in this documentation, which was SCRIBED in my presence. The recorded information has been reviewed and considered accurate. It has been edited as necessary during review. Tilda Burrow, MD

## 2017-04-17 ENCOUNTER — Encounter: Payer: Self-pay | Admitting: Women's Health

## 2017-04-17 ENCOUNTER — Ambulatory Visit (INDEPENDENT_AMBULATORY_CARE_PROVIDER_SITE_OTHER): Payer: BLUE CROSS/BLUE SHIELD | Admitting: Women's Health

## 2017-04-17 VITALS — BP 100/60 | HR 98 | Wt 144.0 lb

## 2017-04-17 DIAGNOSIS — B349 Viral infection, unspecified: Secondary | ICD-10-CM | POA: Diagnosis not present

## 2017-04-17 DIAGNOSIS — Z3A34 34 weeks gestation of pregnancy: Secondary | ICD-10-CM

## 2017-04-17 DIAGNOSIS — Z3403 Encounter for supervision of normal first pregnancy, third trimester: Secondary | ICD-10-CM

## 2017-04-17 DIAGNOSIS — J00 Acute nasopharyngitis [common cold]: Secondary | ICD-10-CM

## 2017-04-17 DIAGNOSIS — Z331 Pregnant state, incidental: Secondary | ICD-10-CM

## 2017-04-17 DIAGNOSIS — O99513 Diseases of the respiratory system complicating pregnancy, third trimester: Secondary | ICD-10-CM

## 2017-04-17 DIAGNOSIS — Z1389 Encounter for screening for other disorder: Secondary | ICD-10-CM

## 2017-04-17 LAB — POCT URINALYSIS DIPSTICK
Blood, UA: NEGATIVE
GLUCOSE UA: NEGATIVE
KETONES UA: NEGATIVE
Leukocytes, UA: NEGATIVE
NITRITE UA: NEGATIVE
Protein, UA: 1

## 2017-04-17 NOTE — Patient Instructions (Addendum)
Brittney Collins, I greatly value your feedback.  If you receive a survey following your visit with [name] today, we appreciate you taking the time to fill it out.  Thanks, Joellyn Haff, CNM, WHNP-BC   Call the office [phone]) or go to [facility] if:  You begin to have strong, frequent contractions  Your water breaks.  Sometimes it is a big gush of fluid, sometimes it is just a trickle that keeps getting your panties wet or running down your legs  You have vaginal bleeding.  It is normal to have a small amount of spotting if your cervix was checked.   You don't feel your baby moving like normal.  If you don't, get you something to eat and drink and lay down and focus on feeling your baby move.  You should feel at least 10 movements in 2 hours.  If you don't, you should call the office or go to [facility].   You have a viral infection that will resolve on its own over time.  Symptoms typically last 3-7 days but can stretch out to 2-3 weeks.  Unfortunately, antibiotics are not helpful for viral infections.  Humidifier and saline nasal spray for nasal congestion  Regular robitussin, cough drops for cough  Warm salt water gargles for sore throat  Mucinex with lots of water to help you cough up the mucous in your chest if needed  Drink plenty of fluids and stay hydrated!  Wash your hands frequently.  Call if you are not improving by 7-10 days.  For Dizzy Spells:   This is usually related to either your blood sugar or your blood pressure dropping  Make sure you are staying well hydrated and drinking enough water so that your urine is clear  Eat small frequent meals and snacks containing protein (meat, eggs, nuts, cheese) so that your blood sugar doesn't drop  If you do get dizzy, sit/lay down and get you something to drink and a snack containing protein- you will usually start feeling better in 10-20 minutes          Preterm Labor and Birth Information The normal  length of a pregnancy is 39-41 weeks. Preterm labor is when labor starts before 37 completed weeks of pregnancy. What are the risk factors for preterm labor? Preterm labor is more likely to occur in women who:  Have certain infections during pregnancy such as a bladder infection, sexually transmitted infection, or infection inside the uterus (chorioamnionitis).  Have a shorter-than-normal cervix.  Have gone into preterm labor before.  Have had surgery on their cervix.  Are younger than age 92 or older than age 93.  Are African American.  Are pregnant with twins or multiple babies (multiple gestation).  Take street drugs or smoke while pregnant.  Do not gain enough weight while pregnant.  Became pregnant shortly after having been pregnant.  What are the symptoms of preterm labor? Symptoms of preterm labor include:  Cramps similar to those that can happen during a menstrual period. The cramps may happen with diarrhea.  Pain in the abdomen or lower back.  Regular uterine contractions that may feel like tightening of the abdomen.  A feeling of increased pressure in the pelvis.  Increased watery or bloody mucus discharge from the vagina.  Water breaking (ruptured amniotic sac).  Why is it important to recognize signs of preterm labor? It is important to recognize signs of preterm labor because babies who are born prematurely may not be fully developed.  This can put them at an increased risk for:  Long-term (chronic) heart and lung problems.  Difficulty immediately after birth with regulating body systems, including blood sugar, body temperature, heart rate, and breathing rate.  Bleeding in the brain.  Cerebral palsy.  Learning difficulties.  Death.  These risks are highest for babies who are born before 34 weeks of pregnancy. How is preterm labor treated? Treatment depends on the length of your pregnancy, your condition, and the health of your baby. It may  involve:  Having a stitch (suture) placed in your cervix to prevent your cervix from opening too early (cerclage).  Taking or being given medicines, such as: ? Hormone medicines. These may be given early in pregnancy to help support the pregnancy. ? Medicine to stop contractions. ? Medicines to help mature the baby's lungs. These may be prescribed if the risk of delivery is high. ? Medicines to prevent your baby from developing cerebral palsy.  If the labor happens before 34 weeks of pregnancy, you may need to stay in the hospital. What should I do if I think I am in preterm labor? If you think that you are going into preterm labor, call your health care provider right away. How can I prevent preterm labor in future pregnancies? To increase your chance of having a full-term pregnancy:  Do not use any tobacco products, such as cigarettes, chewing tobacco, and e-cigarettes. If you need help quitting, ask your health care provider.  Do not use street drugs or medicines that have not been prescribed to you during your pregnancy.  Talk with your health care provider before taking any herbal supplements, even if you have been taking them regularly.  Make sure you gain a healthy amount of weight during your pregnancy.  Watch for infection. If you think that you might have an infection, get it checked right away.  Make sure to tell your health care provider if you have gone into preterm labor before.  This information is not intended to replace advice given to you by your health care provider. Make sure you discuss any questions you have with your health care provider. Document Released: 04/22/2003 Document Revised: 07/13/2015 Document Reviewed: 06/23/2015 Elsevier Interactive Patient Education  2018 [employer]

## 2017-04-17 NOTE — Progress Notes (Signed)
   LOW-RISK PREGNANCY VISIT Patient name: Brittney Collins MRN [id number]  Date of birth: June 12, 1990 Chief Complaint:   Routine Prenatal Visit  History of Present Illness:   Brittney DonnaLindsey Stewart Serpa is a 27 y.o. G1P0 female at 7926w3d with an Estimated Date of Delivery: 05/26/17 being seen today for ongoing management of a low-risk pregnancy.  Today she reports cold sx x 1 day, works [employer], they swabbed her for flu this am and it was neg. Went to waterbirth class last month, has hired [employer] for tub. Occ periods of feeling weak at work around 0900, usually on am's she eats cereal for breakfast. Contractions: Not present.  .  Movement: Present. denies leaking of fluid. Review of Systems:   Pertinent items are noted in HPI Denies abnormal vaginal discharge w/ itching/odor/irritation, headaches, visual changes, shortness of breath, chest pain, abdominal pain, severe nausea/vomiting, or problems with urination or bowel movements unless otherwise stated above. Pertinent History Reviewed:  Reviewed past medical,surgical, social, obstetrical and family history.  Reviewed problem list, medications and allergies. Physical Assessment:   Vitals:   04/17/17 1610  BP: 100/60  Pulse: 98  Weight: 144 lb (65.3 kg)  Body mass index is 24.15 kg/m.        Physical Examination:   General appearance: Well appearing, and in no distress  Mental status: Alert, oriented to person, place, and time  Skin: Warm & dry  Cardiovascular: Normal heart rate noted  Respiratory: Normal respiratory effort, no distress  Abdomen: Soft, gravid, nontender  Pelvic: Cervical exam deferred         Extremities: Edema: Trace  Fetal Status:     Movement: Present    Results for orders placed or performed in visit on 04/17/17 (from the past 24 hour(s))  POCT urinalysis dipstick   Collection Time: 04/17/17  4:20 PM  Result Value Ref Range   Color, UA     Clarity, UA     Glucose, UA neg    Bilirubin, UA     Ketones, UA neg    Spec Grav, UA  1.010 - 1.025   Blood, UA neg    pH, UA  5.0 - 8.0   Protein, UA 1    Urobilinogen, UA  0.2 or 1.0 E.U./dL   Nitrite, UA neg    Leukocytes, UA Negative Negative   Appearance     Odor      Assessment & Plan:  1) Low-risk pregnancy G1P0 at 6626w3d with an Estimated Date of Delivery: 05/26/17   2) Cold sx> gave printed relief measures, call if not improving/worsening   3) Plans waterbirth  4) Occ weak spells> likely r/t hypoglycemia from high carbs/no protein breakfast, gave printed prevention/relief measures. Offered fingerstick Hgb to check for anemia, declines   Meds: No orders of the defined types were placed in this encounter.  Labs/procedures today: none  Plan:  Continue routine obstetrical care   Reviewed: Preterm labor symptoms and general obstetric precautions including but not limited to vaginal bleeding, contractions, leaking of fluid and fetal movement were reviewed in detail with the patient.  All questions were answered  Follow-up: Return in 2 weeks (on 05/02/2017) for LROB w/ CNM.  Orders Placed This Encounter  Procedures  . POCT urinalysis dipstick   Cheral MarkerKimberly R Booker CNM, [facility] SystemWHNP-BC 04/17/2017 4:53 PM

## 2017-05-01 ENCOUNTER — Encounter: Payer: BLUE CROSS/BLUE SHIELD | Admitting: Advanced Practice Midwife

## 2017-05-03 ENCOUNTER — Encounter: Payer: BLUE CROSS/BLUE SHIELD | Admitting: Women's Health

## 2017-05-03 ENCOUNTER — Encounter: Payer: Self-pay | Admitting: Women's Health

## 2017-05-03 ENCOUNTER — Other Ambulatory Visit: Payer: Self-pay

## 2017-05-03 ENCOUNTER — Ambulatory Visit (INDEPENDENT_AMBULATORY_CARE_PROVIDER_SITE_OTHER): Payer: BLUE CROSS/BLUE SHIELD | Admitting: Women's Health

## 2017-05-03 VITALS — BP 110/64 | HR 94 | Wt 149.0 lb

## 2017-05-03 DIAGNOSIS — Z1389 Encounter for screening for other disorder: Secondary | ICD-10-CM

## 2017-05-03 DIAGNOSIS — Z3403 Encounter for supervision of normal first pregnancy, third trimester: Secondary | ICD-10-CM

## 2017-05-03 DIAGNOSIS — Z3A36 36 weeks gestation of pregnancy: Secondary | ICD-10-CM | POA: Diagnosis not present

## 2017-05-03 DIAGNOSIS — Z331 Pregnant state, incidental: Secondary | ICD-10-CM

## 2017-05-03 DIAGNOSIS — O26843 Uterine size-date discrepancy, third trimester: Secondary | ICD-10-CM

## 2017-05-03 LAB — POCT URINALYSIS DIPSTICK
Glucose, UA: NEGATIVE
Ketones, UA: NEGATIVE
PROTEIN UA: NEGATIVE
RBC UA: NEGATIVE

## 2017-05-03 NOTE — Progress Notes (Signed)
   LOW-RISK PREGNANCY VISIT Patient name: Brittney Collins MRN [id number]  Date of birth: May 06, 1990 Chief Complaint:   Routine Prenatal Visit (gbs/gc today)  History of Present Illness:   Brittney DonnaLindsey Stewart Caccamo is a 27 y.o. G1P0 female at 6735w5d with an Estimated Date of Delivery: 05/26/17 being seen today for ongoing management of a low-risk pregnancy.  Today she reports no complaints. Does still want waterbirth, has gone to class.  Contractions: Irregular. Vag. Bleeding: None.  Movement: Present. denies leaking of fluid. Review of Systems:   Pertinent items are noted in HPI Denies abnormal vaginal discharge w/ itching/odor/irritation, headaches, visual changes, shortness of breath, chest pain, abdominal pain, severe nausea/vomiting, or problems with urination or bowel movements unless otherwise stated above. Pertinent History Reviewed:  Reviewed past medical,surgical, social, obstetrical and family history.  Reviewed problem list, medications and allergies. Physical Assessment:   Vitals:   05/03/17 1342  BP: 110/64  Pulse: 94  Weight: 149 lb (67.6 kg)  Body mass index is 24.99 kg/m.        Physical Examination:   General appearance: Well appearing, and in no distress  Mental status: Alert, oriented to person, place, and time  Skin: Warm & dry  Cardiovascular: Normal heart rate noted  Respiratory: Normal respiratory effort, no distress  Abdomen: Soft, gravid, nontender  Pelvic: Cervical exam performed  Dilation: 1 Effacement (%): 50 Station: -2  Extremities: Edema: Trace  Fetal Status: Fetal Heart Rate (bpm): 125 Fundal Height: 33 cm Movement: Present Presentation: Vertex  Results for orders placed or performed in visit on 05/03/17 (from the past 24 hour(s))  POCT urinalysis dipstick   Collection Time: 05/03/17  1:44 PM  Result Value Ref Range   Color, UA     Clarity, UA     Glucose, UA neg    Bilirubin, UA     Ketones, UA neg    Spec Grav, UA  1.010 - 1.025   Blood,  UA neg    pH, UA  5.0 - 8.0   Protein, UA neg    Urobilinogen, UA  0.2 or 1.0 E.U./dL   Nitrite, UA meg    Leukocytes, UA Small (1+) (A) Negative   Appearance     Odor      Assessment & Plan:  1) Low-risk pregnancy G1P0 at 1135w5d with an Estimated Date of Delivery: 05/26/17   2) Desires waterbirth, discussed risks/benefits, consent signed  3) Uterine size<dates, will get efw/afi u/s   Meds: No orders of the defined types were placed in this encounter.  Labs/procedures today: gbs, gc/ct, sve  Plan:  Continue routine obstetrical care   Reviewed: Preterm labor symptoms and general obstetric precautions including but not limited to vaginal bleeding, contractions, leaking of fluid and fetal movement were reviewed in detail with the patient.  All questions were answered  Follow-up: Return for asap for efw/afi u/s (no visit), then 1wk for LROB.  Orders Placed This Encounter  Procedures  . GC/Chlamydia Probe Amp  . Culture, beta strep (group b only)  . US OB Follow Up  . POCT urinalysis dipstick   Cheral MarkerKimberly R Booker CNM, [facility]NP-BC 05/03/2017 2:19 PM

## 2017-05-03 NOTE — Patient Instructions (Addendum)
Brittney Collins, I greatly value your feedback.  If you receive a survey following your visit with [name] today, we appreciate you taking the time to fill it out.  Thanks, Brittney Collins, CNM, WHNP-BC   Call the office [phone]) or go to [facility] if:  You begin to have strong, frequent contractions  Your water breaks.  Sometimes it is a big gush of fluid, sometimes it is just a trickle that keeps getting your panties wet or running down your legs  You have vaginal bleeding.  It is normal to have a small amount of spotting if your cervix was checked.   You don't feel your baby moving like normal.  If you don't, get you something to eat and drink and lay down and focus on feeling your baby move.  You should feel at least 10 movements in 2 hours.  If you don't, you should call the office or go to [place] Contractions Contractions of the uterus can occur throughout pregnancy, but they are not always a sign that you are in labor. You may have practice contractions called Braxton Hicks contractions. These false labor contractions are sometimes confused with true labor. What are Brittney Collins contractions? Braxton Hicks contractions are tightening movements that occur in the muscles of the uterus before labor. Unlike true labor contractions, these contractions do not result in opening (dilation) and thinning of the cervix. Toward the end of pregnancy (32-34 weeks), Braxton Hicks contractions can happen more often and may become stronger. These contractions are sometimes difficult to tell apart from true labor because they can be very uncomfortable. You should not feel embarrassed if you go to the hospital with false labor. Sometimes, the only way to tell if you are in true labor is for your health care provider to look for changes in the cervix. The health care provider will do a physical exam and may monitor your contractions. If you are not in true labor, the exam  should show that your cervix is not dilating and your water has not broken. If there are other health problems associated with your pregnancy, it is completely safe for you to be sent home with false labor. You may continue to have Braxton Hicks contractions until you go into true labor. How to tell the difference between true labor and false labor True labor  Contractions last 30-70 seconds.  Contractions become very regular.  Discomfort is usually felt in the top of the uterus, and it spreads to the lower abdomen and low back.  Contractions do not go away with walking.  Contractions usually become more intense and increase in frequency.  The cervix dilates and gets thinner. False labor  Contractions are usually shorter and not as strong as true labor contractions.  Contractions are usually irregular.  Contractions are often felt in the front of the lower abdomen and in the groin.  Contractions may go away when you walk around or change positions while lying down.  Contractions get weaker and are shorter-lasting as time goes on.  The cervix usually does not dilate or become thin. Follow these instructions at home:  Take over-the-counter and prescription medicines only as told by your health care provider.  Keep up with your usual exercises and follow other instructions from your health care provider.  Eat and drink lightly if you think you are going into labor.  If Braxton Hicks contractions are making you uncomfortable: ? Change your position from lying  down or resting to walking, or change from walking to resting. ? Sit and rest in a tub of warm water. ? Drink enough fluid to keep your urine pale yellow. Dehydration may cause these contractions. ? Do slow and deep breathing several times an hour.  Keep all follow-up prenatal visits as told by your health care provider. This is important. Contact a health care provider if:  You have a fever.  You have continuous pain  in your abdomen. Get help right away if:  Your contractions become stronger, more regular, and closer together.  You have fluid leaking or gushing from your vagina.  You pass blood-tinged mucus (bloody show).  You have bleeding from your vagina.  You have low back pain that you never had before.  You feel your baby's head pushing down and causing pelvic pressure.  Your baby is not moving inside you as much as it used to. Summary  Contractions that occur before labor are called Braxton Hicks contractions, false labor, or practice contractions.  Braxton Hicks contractions are usually shorter, weaker, farther apart, and less regular than true labor contractions. True labor contractions usually become progressively stronger and regular and they become more frequent.  Manage discomfort from [employer] contractions by changing position, resting in a warm bath, drinking plenty of water, or practicing deep breathing. This information is not intended to replace advice given to you by your health care provider. Make sure you discuss any questions you have with your health care provider. Document Released: 06/15/2016 Document Revised: 06/15/2016 Document Reviewed: 06/15/2016 Elsevier Interactive Patient Education  2018 [place]???  Why consider waterbirth? . Gentle birth for babies . Less pain medicine used in labor . May allow for passive descent/less pushing . May reduce perineal tears  . More mobility and instinctive maternal position changes . Increased maternal relaxation . Reduced blood pressure in labor  Is waterbirth safe? What are the risks of infection, drowning or other complications? . Infection o Very low risk (3.7 % for tub vs 4.8% for bed) o 7 in 8000 waterbirths with documented infection o Poorly cleaned equipment most common cause o Slightly lower group B strep transmission rate  . Drowning o Maternal:  - Very low risk    - Related to seizures or fainting o Newborn:  - Very low risk. No evidence of increased risk of respiratory problems in multiple large studies - Physiological protection from breathing under water - Avoid underwater birth if there are any fetal complications - Once baby's head is out of the water, keep it out.  . Birth complication o Some reports of cord trauma, but risk decreased by bringing baby to surface gradually o No evidence of increased risk of shoulder dystocia. Mothers can usually change positions faster in water than in a bed, possibly aiding the maneuvers to free the shoulder.  You must attend a Doren Custard class at [facility]  3rd Wednesday of every month from 7-9pm  [employer] by calling [phone] or online at [email or web address]  Bring [name] the certificate from the class  Waterbirth supplies needed for [facility] patients:  Our practice has a Heritage manager in a Box tub (Regular size) at the hospital that you can borrow  You will need to purchase an accessory kit that has all needed supplies through [employer] [phone] for kit, $65 for liner=$179+tax) or online through [employer]  Or you can purchase the supplies separately: o Single-use disposable tub liner for [employer]  Pool in a Box (REGULAR size) o New garden hose labeled "lead-free", "suitable for drinking water", "non-toxic" OR "water potable" o Garden hose to remove the dirty water o Electric drain pump to remove water (We recommend 792 gallon per hour or greater pump.)  o Fish net o Bathing suit top (optional) o Long-handled mirror (optional)  [employer]- sells EVERYTHING waterbirth related, accessory kits, tubs, Psychologist, forensic (www.thelaborladies.com) this is a great service if you don't want to be responsible for the set up/take down of tub! Just call the Labor Ladies and they will come do it for you for $200! This includes the rental fee for their tub, the  accessory kit, set-up and take down   Things that would prevent you from having a waterbirth:  Premature, <37wks  Previous cesarean birth  Presence of thick meconium-stained fluid  Multiple gestation (Twins, triplets, etc.)  Uncontrolled diabetes  Hypertension  Heavy vaginal bleeding  Non-reassuring fetal heart rate  Active infection (MRSA, etc.)  If your labor has to be induced  Other risk issues identified by your obstetrical provider

## 2017-05-05 LAB — GC/CHLAMYDIA PROBE AMP
Chlamydia trachomatis, NAA: NEGATIVE
Neisseria gonorrhoeae by PCR: NEGATIVE

## 2017-05-06 LAB — CULTURE, BETA STREP (GROUP B ONLY): STREP GP B CULTURE: POSITIVE — AB

## 2017-05-08 ENCOUNTER — Ambulatory Visit (INDEPENDENT_AMBULATORY_CARE_PROVIDER_SITE_OTHER): Payer: BLUE CROSS/BLUE SHIELD

## 2017-05-08 DIAGNOSIS — Z3400 Encounter for supervision of normal first pregnancy, unspecified trimester: Secondary | ICD-10-CM

## 2017-05-08 DIAGNOSIS — N83201 Unspecified ovarian cyst, right side: Secondary | ICD-10-CM

## 2017-05-08 DIAGNOSIS — Z3403 Encounter for supervision of normal first pregnancy, third trimester: Secondary | ICD-10-CM

## 2017-05-08 DIAGNOSIS — O3483 Maternal care for other abnormalities of pelvic organs, third trimester: Secondary | ICD-10-CM | POA: Diagnosis not present

## 2017-05-08 DIAGNOSIS — Z3A37 37 weeks gestation of pregnancy: Secondary | ICD-10-CM

## 2017-05-08 DIAGNOSIS — O26843 Uterine size-date discrepancy, third trimester: Secondary | ICD-10-CM

## 2017-05-08 NOTE — Progress Notes (Signed)
US 37+3 wks,cephalic,posterior pl gr 3,afi 12 cm,right ovarian cyst w/low level echoes 6.4 x 5.9 x 5.4 cm,normal left ovary,fhr 137 bpm,EFW 3652 g 90%

## 2017-05-11 ENCOUNTER — Ambulatory Visit (INDEPENDENT_AMBULATORY_CARE_PROVIDER_SITE_OTHER): Payer: BLUE CROSS/BLUE SHIELD | Admitting: Obstetrics and Gynecology

## 2017-05-11 ENCOUNTER — Encounter: Payer: Self-pay | Admitting: Obstetrics and Gynecology

## 2017-05-11 VITALS — BP 120/70 | HR 98 | Wt 150.6 lb

## 2017-05-11 DIAGNOSIS — Z1389 Encounter for screening for other disorder: Secondary | ICD-10-CM

## 2017-05-11 DIAGNOSIS — Z3A37 37 weeks gestation of pregnancy: Secondary | ICD-10-CM

## 2017-05-11 DIAGNOSIS — Z3403 Encounter for supervision of normal first pregnancy, third trimester: Secondary | ICD-10-CM

## 2017-05-11 DIAGNOSIS — Z331 Pregnant state, incidental: Secondary | ICD-10-CM

## 2017-05-11 LAB — POCT URINALYSIS DIPSTICK
Glucose, UA: NEGATIVE
KETONES UA: NEGATIVE
LEUKOCYTES UA: NEGATIVE
NITRITE UA: NEGATIVE
PROTEIN UA: NEGATIVE
RBC UA: NEGATIVE

## 2017-05-11 NOTE — Progress Notes (Signed)
LOW-RISK PREGNANCY VISIT Patient name: Brittney Collins MRN [id number]  Date of birth: 1990/10/23 Chief Complaint:   Routine Prenatal Visit  History of Present Illness:   Brittney Collins is a 27 y.o. G1P0 female at [redacted]w[redacted]d with an Estimated Date of Delivery: 05/26/17 being seen today for ongoing management of a low-risk pregnancy. She enjoyed the child birthing classes and would like to have a water birth.   Today she reports no complaints. Contractions: Irregular.  .  Movement: Present. denies leaking of fluid. Review of Systems:   Pertinent items are noted in HPI Denies abnormal vaginal discharge w/ itching/odor/irritation, headaches, visual changes, shortness of breath, chest pain, abdominal pain, severe nausea/vomiting, or problems with urination or bowel movements unless otherwise stated above. Pertinent History Reviewed:  Reviewed past medical,surgical, social, obstetrical and family history.  Reviewed problem list, medications and allergies. Physical Assessment:   Vitals:   05/11/17 1135  BP: 120/70  Pulse: 98  Weight: 150 lb 9.6 oz (68.3 kg)  Body mass index is 25.26 kg/m.        Physical Examination:   General appearance: Well appearing, and in no distress  Mental status: Alert, oriented to person, place, and time  Skin: Warm & dry  Cardiovascular: Normal heart rate noted  Respiratory: Normal respiratory effort, no distress  Abdomen: Soft, gravid, nontender  Pelvic: Cervical exam deferred         Extremities: Edema: Trace  Fetal Status:     Movement: Present   FHR: 149. FH: 36 cm  Results for orders placed or performed in visit on 05/11/17 (from the past 24 hour(s))  POCT urinalysis dipstick   Collection Time: 05/11/17 11:40 AM  Result Value Ref Range   Color, UA     Clarity, UA     Glucose, UA neg    Bilirubin, UA     Ketones, UA neg    Spec Grav, UA  1.010 - 1.025   Blood, UA neg    pH, UA  5.0 - 8.0   Protein, UA neg    Urobilinogen, UA  0.2 or  1.0 E.U./dL   Nitrite, UA neg    Leukocytes, UA Negative Negative   Appearance     Odor      Assessment & Plan:  1) Low-risk pregnancy G1P0 at [redacted]w[redacted]d with an Estimated Date of Delivery: 05/26/17    Meds: No orders of the defined types were placed in this encounter.  Labs/procedures today:   Plan:   1. Continue routine obstetrical care.   2. Follow up in 1 week for LROB  Reviewed: Preterm labor symptoms and general obstetric precautions including but not limited to vaginal bleeding, contractions, leaking of fluid and fetal movement were reviewed in detail with the patient.  All questions were answered  Follow-up: Return in about 1 week (around 05/18/2017) for LROB.  Orders Placed This Encounter  Procedures  . POCT urinalysis dipstick   By signing my name below, I, Soijett Blue, attest that this documentation has been prepared under the direction and in the presence of Tilda Burrow, MD. Electronically Signed: Soijett Blue, Scribe. 05/11/17. 9:20 AM.  I personally performed the services described in this documentation, which was SCRIBED in my presence. The recorded information has been reviewed and considered accurate. It has been edited as necessary during review. Tilda Burrow, MD

## 2017-05-17 ENCOUNTER — Ambulatory Visit (INDEPENDENT_AMBULATORY_CARE_PROVIDER_SITE_OTHER): Payer: BLUE CROSS/BLUE SHIELD | Admitting: Advanced Practice Midwife

## 2017-05-17 ENCOUNTER — Encounter: Payer: Self-pay | Admitting: Advanced Practice Midwife

## 2017-05-17 VITALS — BP 114/68 | HR 75 | Wt 151.0 lb

## 2017-05-17 DIAGNOSIS — Z3A38 38 weeks gestation of pregnancy: Secondary | ICD-10-CM

## 2017-05-17 DIAGNOSIS — Z1389 Encounter for screening for other disorder: Secondary | ICD-10-CM

## 2017-05-17 DIAGNOSIS — Z331 Pregnant state, incidental: Secondary | ICD-10-CM

## 2017-05-17 DIAGNOSIS — Z3403 Encounter for supervision of normal first pregnancy, third trimester: Secondary | ICD-10-CM

## 2017-05-17 LAB — POCT URINALYSIS DIPSTICK
Blood, UA: NEGATIVE
GLUCOSE UA: NEGATIVE
Ketones, UA: NEGATIVE
NITRITE UA: NEGATIVE
Protein, UA: NEGATIVE

## 2017-05-17 NOTE — Patient Instructions (Addendum)
AM I IN LABOR? What is labor? Labor is the work that your body does to birth your baby. Your uterus (the womb) contracts. Your cervix (the mouth of the uterus) opens. You will push your baby out into the world.  What do contractions (labor pains) feel like? When they first start, contractions usually feel like cramps during your period. Sometimes you feel pain in your back. Most often, contractions feel like muscles pulling painfully in your lower belly. At first, the contractions will probably be 15 to 20 minutes apart. They will not feel too painful. As labor goes on, the contractions get stronger, closer together, and more painful.  How do I time the contractions? Time your contractions by counting the number of minutes from the start of one contraction to the start of the next contraction.  What should I do when the contractions start? If it is night and you can sleep, sleep. If it happens during the day, here are some things you can do to take care of yourself at home: ? Walk. If the pains you are having are real labor, walking will make the contractions come faster and harder. If the contractions are not going to continue and be real labor, walking will make the contractions slow down. ? Take a shower or bath. This will help you relax. ? Eat. Labor is a big event. It takes a lot of energy. ? Drink water. Not drinking enough water can cause false labor (contractions that hurt but do not open your cervix). If this is true labor, drinking water will help you have strength to get through your labor. ? Take a nap. Get all the rest you can. ? Get a massage. If your labor is in your back, a strong massage on your lower back may feel very good. Getting a foot massage is always good. ? Don't panic. You can do this. Your body was made for this. You are strong!  When should I go to the hospital or call my health care provider? ? Your contractions have been 5 minutes apart or less for at least 1  hour. ? If several contractions are so painful you cannot walk or talk during one. ? Your bag of waters breaks. (You may have a big gush of water or just water that runs down your legs when you walk.)  Are there other reasons to call my health care provider? Yes, you should call your health care provider or go to the hospital if you start to bleed like you are having a period- blood that soaks your underwear or runs down your legs, if you have sudden severe pain, if your baby has not moved for several hours, or if you are leaking green fluid. The rule is as follows: If you are very concerned about something, call      Cervical Ripening: May try one or both  Red Raspberry Leaf capsules: two 300mg or 400mg tablets with each meal, 2-3 times a day  Potential Side Effects Of Raspberry Leaf:  Most women do not experience any side effects from drinking raspberry leaf tea. However, nausea and loose stools are possible   Evening Primrose Oil capsules: 3 500 mg capsules daily.  Some of the potential side effects:  Upset stomach  Loose stools or diarrhea  Headaches  Nausea:    .  

## 2017-05-17 NOTE — Progress Notes (Signed)
  G1P0 1966w5d Estimated Date of Delivery: 05/26/17  Blood pressure 114/68, pulse 75, weight 151 lb (68.5 kg), last menstrual period 08/19/2016.   BP weight and urine results all reviewed and noted.  Please refer to the obstetrical flow sheet for the fundal height and fetal heart rate documentation:  Patient reports good fetal movement, denies any bleeding and no rupture of membranes symptoms or regular contractions. Patient is without complaints. All questions were answered.   Physical Assessment:   Vitals:   05/17/17 1004  BP: 114/68  Pulse: 75  Weight: 151 lb (68.5 kg)  Body mass index is 25.32 kg/m.        Physical Examination:   General appearance: Well appearing, and in no distress  Mental status: Alert, oriented to person, place, and time  Skin: Warm & dry  Cardiovascular: Normal heart rate noted  Respiratory: Normal respiratory effort, no distress  Abdomen: Soft, gravid, nontender  Pelvic: Cervical exam performed  Dilation: 1.5 Effacement (%): 70 Station: -2  Extremities: Edema: Trace  Fetal Status: Fetal Heart Rate (bpm): 140 Fundal Height: 40 cm Movement: Present Presentation: Vertex  Results for orders placed or performed in visit on 05/17/17 (from the past 24 hour(s))  POCT urinalysis dipstick   Collection Time: 05/17/17 10:06 AM  Result Value Ref Range   Color, UA     Clarity, UA     Glucose, UA neg    Bilirubin, UA     Ketones, UA neg    Spec Grav, UA  1.010 - 1.025   Blood, UA neg    pH, UA  5.0 - 8.0   Protein, UA neg    Urobilinogen, UA  0.2 or 1.0 E.U./dL   Nitrite, UA neg    Leukocytes, UA Small (1+) (A) Negative   Appearance     Odor       Orders Placed This Encounter  Procedures  . POCT urinalysis dipstick    Plan:  Continued routine obstetrical care,   Return in about 1 week (around 05/24/2017) for LROB.

## 2017-05-25 ENCOUNTER — Ambulatory Visit (INDEPENDENT_AMBULATORY_CARE_PROVIDER_SITE_OTHER): Payer: BLUE CROSS/BLUE SHIELD | Admitting: Women's Health

## 2017-05-25 ENCOUNTER — Encounter: Payer: Self-pay | Admitting: Women's Health

## 2017-05-25 ENCOUNTER — Encounter (HOSPITAL_COMMUNITY): Payer: Self-pay

## 2017-05-25 ENCOUNTER — Inpatient Hospital Stay (HOSPITAL_COMMUNITY)
Admission: AD | Admit: 2017-05-25 | Discharge: 2017-05-28 | DRG: 768 | Disposition: A | Discharge status: Home / Self Care | Payer: BLUE CROSS/BLUE SHIELD | Source: Ambulatory Visit | Attending: Obstetrics & Gynecology | Admitting: Obstetrics & Gynecology

## 2017-05-25 VITALS — BP 130/80 | HR 88 | Wt 149.0 lb

## 2017-05-25 DIAGNOSIS — Z3A39 39 weeks gestation of pregnancy: Secondary | ICD-10-CM

## 2017-05-25 DIAGNOSIS — O480 Post-term pregnancy: Secondary | ICD-10-CM

## 2017-05-25 DIAGNOSIS — O99824 Streptococcus B carrier state complicating childbirth: Principal | ICD-10-CM | POA: Diagnosis present

## 2017-05-25 DIAGNOSIS — O3663X0 Maternal care for excessive fetal growth, third trimester, not applicable or unspecified: Secondary | ICD-10-CM | POA: Diagnosis not present

## 2017-05-25 DIAGNOSIS — Z1389 Encounter for screening for other disorder: Secondary | ICD-10-CM

## 2017-05-25 DIAGNOSIS — Z3483 Encounter for supervision of other normal pregnancy, third trimester: Secondary | ICD-10-CM | POA: Diagnosis not present

## 2017-05-25 DIAGNOSIS — Z3400 Encounter for supervision of normal first pregnancy, unspecified trimester: Secondary | ICD-10-CM

## 2017-05-25 DIAGNOSIS — Z3403 Encounter for supervision of normal first pregnancy, third trimester: Secondary | ICD-10-CM

## 2017-05-25 DIAGNOSIS — Z331 Pregnant state, incidental: Secondary | ICD-10-CM

## 2017-05-25 LAB — POCT URINALYSIS DIPSTICK
GLUCOSE UA: NEGATIVE
Ketones, UA: NEGATIVE
LEUKOCYTES UA: NEGATIVE
Nitrite, UA: NEGATIVE
Protein, UA: NEGATIVE
RBC UA: 1

## 2017-05-25 LAB — TYPE AND SCREEN
ABO/RH(D): A POS
Antibody Screen: NEGATIVE

## 2017-05-25 LAB — CBC
HEMATOCRIT: 36.5 % (ref 36.0–46.0)
HEMOGLOBIN: 13 g/dL (ref 12.0–15.0)
MCH: 32.8 pg (ref 26.0–34.0)
MCHC: 35.6 g/dL (ref 30.0–36.0)
MCV: 92.2 fL (ref 78.0–100.0)
Platelets: 274 10*3/uL (ref 150–400)
RBC: 3.96 MIL/uL (ref 3.87–5.11)
RDW: 13.1 % (ref 11.5–15.5)
WBC: 17.2 10*3/uL — ABNORMAL HIGH (ref 4.0–10.5)

## 2017-05-25 MED ORDER — LACTATED RINGERS IV SOLN: 500.0000 mL | INTRAVENOUS | Status: DC | PRN

## 2017-05-25 MED ORDER — LACTATED RINGERS IV SOLN: INTRAVENOUS | Status: DC

## 2017-05-25 MED ORDER — PENICILLIN G POT IN DEXTROSE 60000 UNIT/ML IV SOLN: 3.0000 10*6.[IU] | INTRAVENOUS | Status: DC

## 2017-05-25 MED ORDER — OXYTOCIN 40 UNITS IN LACTATED RINGERS INFUSION - SIMPLE MED
2.5000 [IU]/h | INTRAVENOUS | Status: DC
  Administered 2017-05-26: 2.5 [IU]/h via INTRAVENOUS
  Filled 2017-05-25: qty 1000

## 2017-05-25 MED ORDER — OXYCODONE-ACETAMINOPHEN 5-325 MG PO TABS: 2.0000 | ORAL_TABLET | ORAL | Status: DC | PRN

## 2017-05-25 MED ORDER — OXYTOCIN BOLUS FROM INFUSION
500.0000 mL | Freq: Once | INTRAVENOUS | Status: AC
  Administered 2017-05-26: 500 mL via INTRAVENOUS

## 2017-05-25 MED ORDER — LIDOCAINE HCL (PF) 1 % IJ SOLN
30.0000 mL | INTRAMUSCULAR | Status: AC | PRN
  Administered 2017-05-26 (×2): 30 mL via SUBCUTANEOUS
  Filled 2017-05-25 (×2): qty 30

## 2017-05-25 MED ORDER — SODIUM CHLORIDE 0.9 % IV SOLN
2.0000 g | Freq: Once | INTRAVENOUS | Status: AC
  Administered 2017-05-25: 2 g via INTRAVENOUS
  Filled 2017-05-25: qty 2

## 2017-05-25 MED ORDER — OXYCODONE-ACETAMINOPHEN 5-325 MG PO TABS: 1.0000 | ORAL_TABLET | ORAL | Status: DC | PRN

## 2017-05-25 MED ORDER — FLEET ENEMA 7-19 GM/118ML RE ENEM: 1.0000 | ENEMA | RECTAL | Status: DC | PRN

## 2017-05-25 MED ORDER — ACETAMINOPHEN 325 MG PO TABS: 650.0000 mg | ORAL_TABLET | ORAL | Status: DC | PRN

## 2017-05-25 MED ORDER — FENTANYL CITRATE (PF) 100 MCG/2ML IJ SOLN
50.0000 ug | INTRAMUSCULAR | Status: DC | PRN
  Administered 2017-05-25 – 2017-05-26 (×2): 100 ug via INTRAVENOUS
  Filled 2017-05-25 (×2): qty 2

## 2017-05-25 MED ORDER — SOD CITRATE-CITRIC ACID 500-334 MG/5ML PO SOLN: 30.0000 mL | ORAL | Status: DC | PRN

## 2017-05-25 MED ORDER — HYDROXYZINE HCL 50 MG PO TABS
50.0000 mg | ORAL_TABLET | Freq: Four times a day (QID) | ORAL | Status: DC | PRN
  Filled 2017-05-25: qty 1

## 2017-05-25 MED ORDER — ONDANSETRON HCL 4 MG/2ML IJ SOLN: 4.0000 mg | Freq: Four times a day (QID) | INTRAMUSCULAR | Status: DC | PRN

## 2017-05-25 MED ORDER — SODIUM CHLORIDE 0.9 % IV SOLN: 5.0000 10*6.[IU] | Freq: Once | INTRAVENOUS | Status: DC

## 2017-05-25 NOTE — MAU Note (Signed)
Seen in office this am and 5cm. Membranes stripped at that visit. Ctxs closer and stronger. Some brown vag d/c

## 2017-05-25 NOTE — Progress Notes (Signed)
LOW-RISK PREGNANCY VISIT Patient name: Brittney Collins MRN [id number]  Date of birth: 04-Sep-1990 Chief Complaint:   Routine Prenatal Visit (having contraction)  History of Present Illness:   Brittney Collins is a 27 y.o. G1P0 female at [redacted]w[redacted]d with an Estimated Date of Delivery: 05/26/17 being seen today for ongoing management of a low-risk pregnancy.  Today she reports uc's q 5-38mins that woke her up at 0430 this am. Thinks she lost mucous plug last week.Planning waterbirth, using [facility], class and consent done previously.  Contractions: Regular.  .  Movement: Present. denies leaking of fluid. Review of Systems:   Pertinent items are noted in HPI Denies abnormal vaginal discharge w/ itching/odor/irritation, headaches, visual changes, shortness of breath, chest pain, abdominal pain, severe nausea/vomiting, or problems with urination or bowel movements unless otherwise stated above. Pertinent History Reviewed:  Reviewed past medical,surgical, social, obstetrical and family history.  Reviewed problem list, medications and allergies. Physical Assessment:   Vitals:   05/25/17 1051  BP: 130/80  Pulse: 88  Weight: 149 lb (67.6 kg)  Body mass index is 24.99 kg/m.        Physical Examination:   General appearance: Well appearing, and in no distress  Mental status: Alert, oriented to person, place, and time  Skin: Warm & dry  Cardiovascular: Normal heart rate noted  Respiratory: Normal respiratory effort, no distress  Abdomen: Soft, gravid, nontender  Pelvic: Cervical exam performed  Dilation: 4.5 Effacement (%): 80 Station: -2  Extremities: Edema: None  Fetal Status: Fetal Heart Rate (bpm): 143 Fundal Height: 39 cm Movement: Present Presentation: Vertex Offered membrane sweeping, discussed r/b- pt decided to proceed, so membranes swept.   Results for orders placed or performed in visit on 05/25/17 (from the past 24 hour(s))  POCT urinalysis dipstick   Collection Time:  05/25/17 10:57 AM  Result Value Ref Range   Color, UA     Clarity, UA     Glucose, UA neg    Bilirubin, UA     Ketones, UA neg    Spec Grav, UA  1.010 - 1.025   Blood, UA 1    pH, UA  5.0 - 8.0   Protein, UA neg    Urobilinogen, UA  0.2 or 1.0 E.U./dL   Nitrite, UA neg    Leukocytes, UA Negative Negative   Appearance     Odor      Assessment & Plan:  1) Low-risk pregnancy G1P0 at [redacted]w[redacted]d with an Estimated Date of Delivery: 05/26/17   2) Early labor, membranes swept, plans waterbirth   Meds: No orders of the defined types were placed in this encounter.  Labs/procedures today: sve  Plan:  Continue routine obstetrical care   Reviewed: Term labor symptoms and general obstetric precautions including but not limited to vaginal bleeding, contractions, leaking of fluid and fetal movement were reviewed in detail with the patient.  All questions were answered  Follow-up: Return in about 4 days (around 05/29/2017) for LROB, US:BPP.  Orders Placed This Encounter  Procedures  . US FETAL BPP WO NON STRESS  . POCT urinalysis dipstick   Cheral Marker CNM, [facility] 05/25/2017 11:20 AM

## 2017-05-25 NOTE — H&P (Signed)
Brittney Collins is a 27 y.o. female G1P0 with IUP at [redacted]w[redacted]d presenting for contractions. Pt states she has been having regular, every 2-3 minutes contractions, associated with none vaginal bleeding for 2-3 hours. Membranes were stripped in the office today (was 4.5/80/-2).  Membranes are intact, with active fetal movement.   P[facility]are at [facility]  Prenatal History/Complications: none Past Medical History: History reviewed. No pertinent past medical history. Rt ov cyst 7cms @ 20 weeks; f/u postpartum Past Surgical History: Past Surgical History:  Procedure Laterality Date  . WISDOM TOOTH EXTRACTION      Obstetrical History: OB History    Gravida  1   Para      Term      Preterm      AB      Living        SAB      TAB      Ectopic      Multiple      Live Births               Social History: Social History   Socioeconomic History  . Marital status: Single    Spouse name: Not on file  . Number of children: Not on file  . Years of education: Not on file  . Highest education level: Not on file  Occupational History  . Not on file  Social Needs  . Financial resource strain: Not on file  . Food insecurity:    Worry: Not on file    Inability: Not on file  . Transportation needs:    Medical: Not on file    Non-medical: Not on file  Tobacco Use  . Smoking status: Never Smoker  . Smokeless tobacco: Never Used  Substance and Sexual Activity  . Alcohol use: No  . Drug use: No  . Sexual activity: Yes    Partners: Male    Birth control/protection: None  Lifestyle  . Physical activity:    Days per week: Not on file    Minutes per session: Not on file  . Stress: Not on file  Relationships  . Social connections:    Talks on phone: Not on file    Gets together: Not on file    Attends religious service: Not on file    Active member of club or organization: Not on file    Attends meetings of clubs or organizations: Not on file    Relationship  status: Not on file  Other Topics Concern  . Not on file  Social History Narrative  . Not on file    Family History: Family History  Problem Relation Age of Onset  . Breast cancer Mother 43  . Hypertension Mother     Allergies: No Known Allergies  Medications Prior to Admission  Medication Sig Dispense Refill Last Dose  . Prenat-FeAsp-Meth-FA-DHA w/o A (PRENATE PIXIE) 10-0.6-0.4-200 MG CAPS Take 1 capsule by mouth daily. 30 capsule 11 05/24/2017 at Unknown time      [facility]  Initiated Care at  8 weeks  FOB matthew  Dating By 8 week [name]  Pap 7/28/17normal  GC/CT Initial:    -/-            36+wks:  -/-  Genetic Screen NT/IT: neg  CF screen declined  Anatomic [name] Normal female  Flu vaccine 11/20/16  Tdap Recommended ~ 28wks  Glucose Screen  2 hr normal: 71/97/120  GBS Pos  Feed Preference breast  Contraception POPs  Circumcision yes  Childbirth Classes Interested, info given-doing online class  Pediatrician [place]      Review of Systems   Constitutional: Negative for fever and chills Eyes: Negative for visual disturbances Respiratory: Negative for shortness of breath, dyspnea Cardiovascular: Negative for chest pain or palpitations  Gastrointestinal: Negative for vomiting, diarrhea and constipation.  POSITIVE for abdominal pain (contractions) Genitourinary: Negative for dysuria and urgency Musculoskeletal: Negative for back pain, joint pain, myalgias  Neurological: Negative for dizziness and headaches      Blood pressure 127/87, pulse 90, temperature 98.4 F (36.9 C), resp. rate 20, height 5\' 5"  (1.651 m), weight 67.1 kg (148 lb), last menstrual period 08/19/2016. General appearance: alert, cooperative and mild distress Lungs: clear to auscultation bilaterally Heart: regular rate and rhythm Abdomen: soft, non-tender; bowel sounds normal Extremities: Homans sign is negative, no sign of DVT DTR's 2+ Presentation: cephalic Fetal monitoring   Baseline: 140 bpm, Variability: Good {> 6 bpm), Accelerations: Reactive and Decelerations: Absent Uterine activity  q 2-3 Dilation: 7 Effacement (%): 100 Station: -1 Exam by:: Camelia Enganielle Simpson RN   Prenatal labs: ABO, Rh: A/Positive/-- (10/04 1621) Antibody: Negative (01/21 0855) Rubella: 5.26 (10/04 1621) RPR: Non Reactive (01/21 0855)  HBsAg: Negative (10/04 1621)  HIV: Non Reactive (01/21 0855)    Prenatal Transfer Tool  Maternal Diabetes: No Genetic Screening: Normal Maternal Ultrasounds/Referrals: Normal Fetal Ultrasounds or other Referrals:  None Maternal Substance Abuse:  No Significant Maternal Medications:  None Significant Maternal Lab Results: Lab values include: Group B Strep positive     Results for orders placed or performed during the hospital encounter of 05/25/17 (from the past 24 hour(s))  CBC   Collection Time: 05/25/17  9:10 PM  Result Value Ref Range   WBC 17.2 (H) 4.0 - 10.5 K/uL   RBC 3.96 3.87 - 5.11 MIL/uL   Hemoglobin 13.0 12.0 - 15.0 g/dL   HCT 78.436.5 69.636.0 - 29.546.0 %   MCV 92.2 78.0 - 100.0 fL   MCH 32.8 26.0 - 34.0 pg   MCHC 35.6 30.0 - 36.0 g/dL   RDW 28.413.1 13.211.5 - 44.015.5 %   Platelets 274 150 - 400 K/uL  Results for orders placed or performed in visit on 05/25/17 (from the past 24 hour(s))  POCT urinalysis dipstick   Collection Time: 05/25/17 10:57 AM  Result Value Ref Range   Color, UA     Clarity, UA     Glucose, UA neg    Bilirubin, UA     Ketones, UA neg    Spec Grav, UA  1.010 - 1.025   Blood, UA 1    pH, UA  5.0 - 8.0   Protein, UA neg    Urobilinogen, UA  0.2 or 1.0 E.U./dL   Nitrite, UA neg    Leukocytes, UA Negative Negative   Appearance     Odor      Assessment: Brittney Collins is a 27 y.o. G1P0 with an IUP at 6517w6d presenting for active labor  Plan: #Labor: expectant management; plans waterbirth #Pain:  Per request #FWB Cat 1 #ID: GBS: Ampicillin     Jacklyn ShellFrances Cresenzo-Dishmon 05/25/2017, 9:29 PM

## 2017-05-25 NOTE — Patient Instructions (Signed)
Brittney Collins, I greatly value your feedback.  If you receive a survey following your visit with [name] today, we appreciate you taking the time to fill it out.  Thanks, [name], CNM, WHNP-BC   Call the office [phone]) or go to [facility] if:  You begin to have strong, frequent contractions  Your water breaks.  Sometimes it is a big gush of fluid, sometimes it is just a trickle that keeps getting your panties wet or running down your legs  You have vaginal bleeding.  It is normal to have a small amount of spotting if your cervix was checked.   You don't feel your baby moving like normal.  If you don't, get you something to eat and drink and lay down and focus on feeling your baby move.  You should feel at least 10 movements in 2 hours.  If you don't, you should call the office or go to [place] Contractions Contractions of the uterus can occur throughout pregnancy, but they are not always a sign that you are in labor. You may have practice contractions called Braxton Hicks contractions. These false labor contractions are sometimes confused with true labor. What are [place] contractions? Braxton Hicks contractions are tightening movements that occur in the muscles of the uterus before labor. Unlike true labor contractions, these contractions do not result in opening (dilation) and thinning of the cervix. Toward the end of pregnancy (32-34 weeks), Braxton Hicks contractions can happen more often and may become stronger. These contractions are sometimes difficult to tell apart from true labor because they can be very uncomfortable. You should not feel embarrassed if you go to the hospital with false labor. Sometimes, the only way to tell if you are in true labor is for your health care provider to look for changes in the cervix. The health care provider will do a physical exam and may monitor your contractions. If you are not in true labor, the exam  should show that your cervix is not dilating and your water has not broken. If there are other health problems associated with your pregnancy, it is completely safe for you to be sent home with false labor. You may continue to have Braxton Hicks contractions until you go into true labor. How to tell the difference between true labor and false labor True labor  Contractions last 30-70 seconds.  Contractions become very regular.  Discomfort is usually felt in the top of the uterus, and it spreads to the lower abdomen and low back.  Contractions do not go away with walking.  Contractions usually become more intense and increase in frequency.  The cervix dilates and gets thinner. False labor  Contractions are usually shorter and not as strong as true labor contractions.  Contractions are usually irregular.  Contractions are often felt in the front of the lower abdomen and in the groin.  Contractions may go away when you walk around or change positions while lying down.  Contractions get weaker and are shorter-lasting as time goes on.  The cervix usually does not dilate or become thin. Follow these instructions at home:  Take over-the-counter and prescription medicines only as told by your health care provider.  Keep up with your usual exercises and follow other instructions from your health care provider.  Eat and drink lightly if you think you are going into labor.  If Braxton Hicks contractions are making you uncomfortable: ? Change your position from lying  down or resting to walking, or change from walking to resting. ? Sit and rest in a tub of warm water. ? Drink enough fluid to keep your urine pale yellow. Dehydration may cause these contractions. ? Do slow and deep breathing several times an hour.  Keep all follow-up prenatal visits as told by your health care provider. This is important. Contact a health care provider if:  You have a fever.  You have continuous pain  in your abdomen. Get help right away if:  Your contractions become stronger, more regular, and closer together.  You have fluid leaking or gushing from your vagina.  You pass blood-tinged mucus (bloody show).  You have bleeding from your vagina.  You have low back pain that you never had before.  You feel your baby's head pushing down and causing pelvic pressure.  Your baby is not moving inside you as much as it used to. Summary  Contractions that occur before labor are called Braxton Hicks contractions, false labor, or practice contractions.  Braxton Hicks contractions are usually shorter, weaker, farther apart, and less regular than true labor contractions. True labor contractions usually become progressively stronger and regular and they become more frequent.  Manage discomfort from [facility] contractions by changing position, resting in a warm bath, drinking plenty of water, or practicing deep breathing. This information is not intended to replace advice given to you by your health care provider. Make sure you discuss any questions you have with your health care provider. Document Released: 06/15/2016 Document Revised: 06/15/2016 Document Reviewed: 06/15/2016 Elsevier Interactive Patient Education  2018 [employer].

## 2017-05-26 ENCOUNTER — Encounter (HOSPITAL_COMMUNITY): Payer: Self-pay | Admitting: *Deleted

## 2017-05-26 ENCOUNTER — Other Ambulatory Visit: Payer: Self-pay

## 2017-05-26 DIAGNOSIS — O99824 Streptococcus B carrier state complicating childbirth: Secondary | ICD-10-CM

## 2017-05-26 DIAGNOSIS — Z3A39 39 weeks gestation of pregnancy: Secondary | ICD-10-CM

## 2017-05-26 LAB — ABO/RH: ABO/RH(D): A POS

## 2017-05-26 LAB — RPR: RPR: NONREACTIVE

## 2017-05-26 MED ORDER — ACETAMINOPHEN 325 MG PO TABS: 650.0000 mg | ORAL_TABLET | ORAL | Status: DC | PRN

## 2017-05-26 MED ORDER — MEASLES, MUMPS & RUBELLA VAC ~~LOC~~ INJ
0.5000 mL | INJECTION | Freq: Once | SUBCUTANEOUS | Status: DC
  Filled 2017-05-26: qty 0.5

## 2017-05-26 MED ORDER — OXYCODONE-ACETAMINOPHEN 5-325 MG PO TABS: 2.0000 | ORAL_TABLET | ORAL | Status: DC | PRN

## 2017-05-26 MED ORDER — WITCH HAZEL-GLYCERIN EX PADS
1.0000 "application " | MEDICATED_PAD | CUTANEOUS | Status: DC | PRN
  Administered 2017-05-26: 1 via TOPICAL

## 2017-05-26 MED ORDER — FLEET ENEMA 7-19 GM/118ML RE ENEM: 1.0000 | ENEMA | RECTAL | Status: DC | PRN

## 2017-05-26 MED ORDER — DOCUSATE SODIUM 100 MG PO CAPS
100.0000 mg | ORAL_CAPSULE | Freq: Every day | ORAL | Status: DC
  Administered 2017-05-27: 100 mg via ORAL
  Filled 2017-05-26: qty 1

## 2017-05-26 MED ORDER — ZOLPIDEM TARTRATE 5 MG PO TABS: 5.0000 mg | ORAL_TABLET | Freq: Every evening | ORAL | Status: DC | PRN

## 2017-05-26 MED ORDER — ONDANSETRON HCL 4 MG/2ML IJ SOLN: 4.0000 mg | INTRAMUSCULAR | Status: DC | PRN

## 2017-05-26 MED ORDER — ONDANSETRON HCL 4 MG/2ML IJ SOLN: 4.0000 mg | Freq: Four times a day (QID) | INTRAMUSCULAR | Status: DC | PRN

## 2017-05-26 MED ORDER — DIBUCAINE 1 % RE OINT: 1.0000 "application " | TOPICAL_OINTMENT | RECTAL | Status: DC | PRN

## 2017-05-26 MED ORDER — TERBUTALINE SULFATE 1 MG/ML IJ SOLN
0.2500 mg | Freq: Once | INTRAMUSCULAR | Status: DC | PRN
  Filled 2017-05-26: qty 1

## 2017-05-26 MED ORDER — SOD CITRATE-CITRIC ACID 500-334 MG/5ML PO SOLN: 30.0000 mL | ORAL | Status: DC | PRN

## 2017-05-26 MED ORDER — SENNOSIDES-DOCUSATE SODIUM 8.6-50 MG PO TABS
2.0000 | ORAL_TABLET | Freq: Every day | ORAL | Status: DC
  Administered 2017-05-26 – 2017-05-28 (×2): 2 via ORAL
  Filled 2017-05-26: qty 2

## 2017-05-26 MED ORDER — FENTANYL CITRATE (PF) 100 MCG/2ML IJ SOLN: 50.0000 ug | INTRAMUSCULAR | Status: DC | PRN

## 2017-05-26 MED ORDER — POLYETHYLENE GLYCOL 3350 17 G PO PACK
17.0000 g | PACK | Freq: Every day | ORAL | Status: DC
  Filled 2017-05-26 (×3): qty 1

## 2017-05-26 MED ORDER — OXYCODONE-ACETAMINOPHEN 5-325 MG PO TABS: 1.0000 | ORAL_TABLET | ORAL | Status: DC | PRN

## 2017-05-26 MED ORDER — DIPHENHYDRAMINE HCL 25 MG PO CAPS: 25.0000 mg | ORAL_CAPSULE | Freq: Four times a day (QID) | ORAL | Status: DC | PRN

## 2017-05-26 MED ORDER — METHYLERGONOVINE MALEATE 0.2 MG/ML IJ SOLN: 0.2000 mg | INTRAMUSCULAR | Status: DC | PRN

## 2017-05-26 MED ORDER — ONDANSETRON HCL 4 MG PO TABS: 4.0000 mg | ORAL_TABLET | ORAL | Status: DC | PRN

## 2017-05-26 MED ORDER — LACTATED RINGERS IV SOLN: 500.0000 mL | INTRAVENOUS | Status: DC | PRN

## 2017-05-26 MED ORDER — OXYTOCIN BOLUS FROM INFUSION: 500.0000 mL | Freq: Once | INTRAVENOUS | Status: DC

## 2017-05-26 MED ORDER — COCONUT OIL OIL: 1.0000 "application " | TOPICAL_OIL | Status: DC | PRN

## 2017-05-26 MED ORDER — SIMETHICONE 80 MG PO CHEW: 80.0000 mg | CHEWABLE_TABLET | ORAL | Status: DC | PRN

## 2017-05-26 MED ORDER — LIDOCAINE HCL (PF) 1 % IJ SOLN
30.0000 mL | INTRAMUSCULAR | Status: DC | PRN
  Filled 2017-05-26: qty 30

## 2017-05-26 MED ORDER — SODIUM CHLORIDE 0.9 % IV SOLN
2.0000 g | Freq: Once | INTRAVENOUS | Status: AC
  Administered 2017-05-26: 2 g via INTRAVENOUS
  Filled 2017-05-26: qty 2

## 2017-05-26 MED ORDER — OXYTOCIN 40 UNITS IN LACTATED RINGERS INFUSION - SIMPLE MED: 2.5000 [IU]/h | INTRAVENOUS | Status: DC

## 2017-05-26 MED ORDER — BENZOCAINE-MENTHOL 20-0.5 % EX AERO
1.0000 "application " | INHALATION_SPRAY | CUTANEOUS | Status: DC | PRN
  Administered 2017-05-26: 1 via TOPICAL
  Filled 2017-05-26: qty 56

## 2017-05-26 MED ORDER — IBUPROFEN 600 MG PO TABS
600.0000 mg | ORAL_TABLET | Freq: Four times a day (QID) | ORAL | Status: DC
  Administered 2017-05-26 – 2017-05-28 (×8): 600 mg via ORAL
  Filled 2017-05-26 (×8): qty 1

## 2017-05-26 MED ORDER — BISACODYL 10 MG RE SUPP: 10.0000 mg | Freq: Every day | RECTAL | Status: DC | PRN

## 2017-05-26 MED ORDER — PRENATAL MULTIVITAMIN CH
1.0000 | ORAL_TABLET | Freq: Every day | ORAL | Status: DC
  Administered 2017-05-27: 1 via ORAL
  Filled 2017-05-26: qty 1

## 2017-05-26 MED ORDER — OXYTOCIN 40 UNITS IN LACTATED RINGERS INFUSION - SIMPLE MED
1.0000 m[IU]/min | INTRAVENOUS | Status: DC
  Administered 2017-05-26: 2 m[IU]/min via INTRAVENOUS

## 2017-05-26 MED ORDER — OXYCODONE HCL 5 MG PO TABS: 5.0000 mg | ORAL_TABLET | ORAL | Status: DC | PRN

## 2017-05-26 MED ORDER — TETANUS-DIPHTH-ACELL PERTUSSIS 5-2.5-18.5 LF-MCG/0.5 IM SUSP: 0.5000 mL | Freq: Once | INTRAMUSCULAR | Status: DC

## 2017-05-26 MED ORDER — METHYLERGONOVINE MALEATE 0.2 MG PO TABS: 0.2000 mg | ORAL_TABLET | ORAL | Status: DC | PRN

## 2017-05-26 MED ORDER — FLEET ENEMA 7-19 GM/118ML RE ENEM: 1.0000 | ENEMA | Freq: Every day | RECTAL | Status: DC | PRN

## 2017-05-26 NOTE — Progress Notes (Signed)
[facility]; call light, safety precautions, and fall prevention plan reviewed.  Handbook reviewed as well; patient instructed on how to order food and how to call the nurse's phone.  Questions answered; will continue to monitor.

## 2017-05-26 NOTE — Lactation Note (Signed)
This note was copied from a baby's chart. Lactation Consultation Note  Patient Name: Boy Philemon KingdomLindsey Cypert AVWUJ'WToday's Date: 05/26/2017 Reason for consult: Initial assessment;1st time breastfeeding;Primapara;Term;Nipple pain/trauma  8611 hours old female who is being exclusively BF by his mother, she's a P1. RN requested LC assistance due to nipple pain. Per mom nipples are really sore and she can't latch baby on or even hand express, nipples are really sensitive. Baby was asleep when entering the room, offered assistance with latch but mom politely declined, saying that her RN and charge RN previously tried to help her to latch baby on and hand expressed but it was too painful and she wasn't ready to try again.  Both nipples still look intact upon examination, mom is probably experiencing transient soreness. Left nipple look a larger than the right one, and also the right breast seems to start getting full unlike the left one. Couldn't assess how compressible the tissue/areola was since mom did not consent to hand express, the pain was just too much for her. Asked mom how does she feel about pumping and she said that is something she'd consider trying. Set up mom with a DEBP, reviewed pump instructions, cleaning and storage; as well as milk storage guidelines. Discussed with mom the possibility of using a NS; explained that the NS is a barrier and it may help to make the feedings at the breast more comfortable; mom verbalized understanding. RN came into the room to do baby's assessment, requested some coconut oil to her and explained to mom how to use it. Treatment for sore nipples was also discussed.  Left a NS # 24 with mom, she'll call for latch assistance for the next feeding to get her sized and see if it helps with her nipple pain; she'll let us know when she's ready to put baby back to the breast again. In the meantime, she'll be pumping every 3 hours and at least once at night, she already got some drops of  colostrum in her first pumping session, had to set up the pump at its lowest setting. Explained to mom how to do finger feeding with baby.  Reviewed BF brochure, BF resources, nipple shield instructions and feeding diary. Mom is aware of LC services and will call PRN.   Maternal Data Formula Feeding for Exclusion: No Has patient been taught Hand Expression?: Yes Does the patient have breastfeeding experience prior to this delivery?: No  Feeding Feeding Type: Breast Fed  LATCH Score Latch: Grasps breast easily, tongue down, lips flanged, rhythmical sucking.  Audible Swallowing: A few with stimulation  Type of Nipple: Everted at rest and after stimulation  Comfort (Breast/Nipple): Engorged, cracked, bleeding, large blisters, severe discomfort(MOB has severe discomfort with latching )  Hold (Positioning): Assistance needed to correctly position infant at breast and maintain latch.  LATCH Score: 6  Interventions Interventions: Breast feeding basics reviewed;Breast massage;Expressed milk;DEBP  Lactation Tools Discussed/Used Tools: Pump;Nipple Shields Nipple shield size: 24 Breast pump type: Double-Electric Breast Pump WIC Program: No Pump Review: Setup, frequency, and cleaning;Milk Storage Initiated by:: MPeck Date initiated:: 05/26/17   Consult Status Consult Status: Follow-up Date: 05/27/17 Follow-up type: In-patient    Davene Jobin Venetia ConstableS Antoinett Dorman 05/26/2017, 6:18 PM

## 2017-05-26 NOTE — Progress Notes (Signed)
AROM w/clear fluid a little while ago.  Pushing in tub for about an hour.  Baby at +2  FHR stable.  Making good progress.

## 2017-05-26 NOTE — Progress Notes (Signed)
Vitals:   05/25/17 2144 05/25/17 2323  BP: 134/82   Pulse: (!) 108   Resp: 18 18  Temp: 97.6 F (36.4 C) (!) 97.4 F (36.3 C)   Has been in tub, now using nitrous. cx 9/100/0/  FHR 150 doppler during and after ctx.  Ctx q 2 minutes.  Continue present mgt.

## 2017-05-26 NOTE — Progress Notes (Addendum)
L&D Note G1 @ 40 admitted with active labor.   CTSP to assess for vacuum assisted VD. Patient has been pushing for approx 4 hours and some fetal head seen with pushing but maternal effort waning   Patient Vitals for the past 24 hrs:  BP Temp Temp src Pulse Resp Height Weight  05/26/17 0442 (!) 138/97 98.6 F (37 C) Oral 97 18 - -  05/25/17 2323 - (!) 97.4 F (36.3 C) Oral - 18 - -  05/25/17 2144 134/82 97.6 F (36.4 C) Oral (!) 108 18 5\' 5"  (1.651 m) 148 lb (67.1 kg)  05/25/17 1950 127/87 - - 90 - - -  05/25/17 1933 (!) 151/83 98.4 F (36.9 C) - 95 20 5\' 5"  (1.651 m) 148 lb (67.1 kg)   Normal FHR with intermittent monitoring  NAD  +3 feels LOA, pelvis feels adequate, minimal caput. Minimal movement with patient pushing EFW 3700gm  U/s showed concern for LGA with EFW 90% 3652gm, AC 93% on 3/26 Labs: normal 2hr GTT  A/p: pt stable D/w pt that I'd like to have fetus down lower before offering VAVD, given minimal effort with pushing and station, but I feel pt is candidate now for forceps assisted vacuum delivery but I recommend she have an epidural before attempting FAVD and risk of larger tear d/w her. Pt doesn't want epidural. D/w her options are to keep pushing to try and bring fetus down lower or get an epidural and I can try FAVD. Pt to keep pushing. Will reassess in approx 1 hour  Cornelia Copaharlie Yandriel Boening, Jr MD Attending Center for [facility] 05/26/2017 Time: [phone]

## 2017-05-26 NOTE — Anesthesia Pain Management Evaluation Note (Signed)
  CRNA Pain Management Visit Note  Patient: Brittney Collins, 27 y.o., female  "Hello [facility] am a [facility]. We have an anesthesia team available at all times to provide care throughout the hospital, including epidural management and anesthesia for C-section. I don't know your plan for the delivery whether it a natural birth, water birth, IV sedation, nitrous supplementation, doula or epidural, but we want to meet your pain goals."   1.Was your pain managed to your expectations on prior hospitalizations?   yes  2.What is your expectation for pain management during this hospitalization?     Nursing support  3.How can we help you reach that goal? Nursing intervention  Record the patient's initial score and the patient's pain goal.   Pain: 10/10  Pain Goal: 10/10 [facility] wants you to be able to say your pain was always managed very well.  Salome ArntSterling, Ebelyn Bohnet Marie 05/26/2017

## 2017-05-27 NOTE — Progress Notes (Signed)
Post Partum Day 1 Subjective: no complaints, up ad lib and no BM as of yet  Objective: Blood pressure 118/65, pulse 60, temperature 97.6 F (36.4 C), temperature source Oral, resp. rate 18, height 5\' 5"  (1.651 m), weight 148 lb (67.1 kg), last menstrual period 08/19/2016, SpO2 99 %.  Physical Exam:  General: alert and no distress Lochia: appropriate Uterine Fundus: firm Incision: N/A DVT Evaluation: No evidence of DVT seen on physical exam.  Recent Labs    05/25/17 2110  HGB 13.0  HCT 36.5    Assessment/Plan: Plan for discharge tomorrow and Contraception POP though we discussed alternatives at length and patient will consider this for her f/u visit.  Baby remains in NICU. She has not had a BM since her 3rd degree but has been taking stool softeners.   LOS: 2 days   Wendee BeaversDavid J McMullen 05/27/2017, 7:33 AM

## 2017-05-28 ENCOUNTER — Encounter (HOSPITAL_COMMUNITY): Payer: Self-pay

## 2017-05-28 NOTE — Discharge Summary (Addendum)
OB Discharge Summary     Patient Name: Brittney Collins DOB: [birth year] MRN: [id number]  Date of admission: 05/25/2017 Delivering MD: [place] Bing   Date of discharge: 05/28/2017  Admitting diagnosis: 39.6WKS CTX Intrauterine pregnancy: [redacted]w[redacted]d     Secondary diagnosis:  Active Problems:   SVD (spontaneous vaginal delivery)  Additional problems: none     Discharge diagnosis: Term Pregnancy Delivered                                                                                                Post partum procedures: 3rd degree repair  Augmentation: AROM and Pitocin  Complications: None  Hospital course:  Onset of Labor With Vaginal Delivery     27 y.o. yo G1P0 at [redacted]w[redacted]d was admitted in Latent Labor on 05/25/2017. Patient had an uncomplicated labor course as follows:  Membrane Rupture Time/Date: 1:23 AM ,05/26/2017   Intrapartum Procedures: Episiotomy: None [1]                                         Lacerations:  3rd degree [4]  Patient had a delivery of a Viable infant. 05/26/2017  Information for the patient's newborn:  Brittney Collins [[id number]]  Delivery Method: Vaginal, Spontaneous(Filed from Delivery Summary)    Pateint had an uncomplicated postpartum course.  She is ambulating, tolerating a regular diet, passing flatus, and urinating well. Patient is discharged home in stable condition on 05/28/17.   Physical exam  Vitals:   05/26/17 2030 05/27/17 0603 05/27/17 1921 05/28/17 0526  BP: 134/85 118/65 118/71 117/79  Pulse: (!) 103 60 63 75  Resp: 18 18 17 18   Temp: 97.6 F (36.4 C) 97.6 F (36.4 C) 97.6 F (36.4 C) 98.2 F (36.8 C)  TempSrc: Oral Oral Oral Oral  SpO2: 99% 99% 99% 100%  Weight:      Height:       General: alert and no distress Lochia: appropriate Uterine Fundus: firm Incision: N/A DVT Evaluation: No evidence of DVT seen on physical exam. Labs: Lab Results  Component Value Date   WBC 17.2 (H) 05/25/2017   HGB 13.0 05/25/2017   HCT 36.5 05/25/2017   MCV 92.2 05/25/2017   PLT 274 05/25/2017   CMP Latest Ref Rng & Units 09/10/2015  Glucose 65 - 99 mg/dL 79  BUN 7 - 25 mg/dL 10  Creatinine 4.40 - 1.02 mg/dL 7.25  Sodium 366 - 440 mmol/L 139  Potassium 3.5 - 5.3 mmol/L 3.8  Chloride 98 - 110 mmol/L 104  CO2 20 - 31 mmol/L 24  Calcium 8.6 - 10.2 mg/dL 9.7  Total Protein 6.1 - 8.1 g/dL 7.1  Total Bilirubin 0.2 - 1.2 mg/dL 0.3  Alkaline Phos 33 - 115 U/L 40  AST 10 - 30 U/L 18  ALT 6 - 29 U/L 15    Discharge instruction: per After Visit Summary and "Baby and Me Booklet".  After visit meds:  Allergies as of 05/28/2017   No Known Allergies  Medication List    STOP taking these medications   PRENATE PIXIE 10-0.6-0.4-200 MG Caps       Diet: routine diet  Activity: Advance as tolerated. Pelvic rest for 6 weeks.   Outpatient follow up:6 weeks Follow up Appt: Future Appointments  Date Time Provider Department Center  05/29/2017 10:30 AM [facility] [name] [facility] None  05/29/2017 11:15 AM Cheral Marker, CNM [facility]   Follow up Visit:No follow-ups on file.  Postpartum contraception: LARC  Newborn Data: Live born female  Birth Weight: 9 lb 2 oz (4140 g) APGAR: 7, 8  Newborn Delivery   Birth date/time:  05/26/2017 06:51:00 Delivery type:  Vaginal, Spontaneous     Baby Feeding: Bottle and Breast Disposition:NICU   05/28/2017 Wendee Beavers, DO  OB FELLOW DISCHARGE ATTESTATION  I have seen and examined this patient. I agree with above documentation and have made edits as needed.   Caryl Ada, DO OB Fellow 9:36 AM

## 2017-05-28 NOTE — Discharge Instructions (Signed)
Postpartum Care After Vaginal Delivery °The period of time right after you deliver your newborn is called the postpartum period. °What kind of medical care will I receive? °· You may continue to receive fluids and medicines through an IV tube inserted into one of your veins. °· If an incision was made near your vagina (episiotomy) or if you had some vaginal tearing during delivery, cold compresses may be placed on your episiotomy or your tear. This helps to reduce pain and swelling. °· You may be given a squirt bottle to use when you go to the bathroom. You may use this until you are comfortable wiping as usual. To use the squirt bottle, follow these steps: °? Before you urinate, fill the squirt bottle with warm water. Do not use hot water. °? After you urinate, while you are sitting on the toilet, use the squirt bottle to rinse the area around your urethra and vaginal opening. This rinses away any urine and blood. °? You may do this instead of wiping. As you start healing, you may use the squirt bottle before wiping yourself. Make sure to wipe gently. °? Fill the squirt bottle with clean water every time you use the bathroom. °· You will be given sanitary pads to wear. °How can I expect to feel? °· You may not feel the need to urinate for several hours after delivery. °· You will have some soreness and pain in your abdomen and vagina. °· If you are breastfeeding, you may have uterine contractions every time you breastfeed for up to several weeks postpartum. Uterine contractions help your uterus return to its normal size. °· It is normal to have vaginal bleeding (lochia) after delivery. The amount and appearance of lochia is often similar to a menstrual period in the first week after delivery. It will gradually decrease over the next few weeks to a dry, yellow-brown discharge. For most women, lochia stops completely by 6-8 weeks after delivery. Vaginal bleeding can vary from woman to woman. °· Within the first few  days after delivery, you may have breast engorgement. This is when your breasts feel heavy, full, and uncomfortable. Your breasts may also throb and feel hard, tightly stretched, warm, and tender. After this occurs, you may have milk leaking from your breasts. Your health care provider can help you relieve discomfort due to breast engorgement. Breast engorgement should go away within a few days. °· You may feel more sad or worried than normal due to hormonal changes after delivery. These feelings should not last more than a few days. If these feelings do not go away after several days, speak with your health care provider. °How should I care for myself? °· Tell your health care provider if you have pain or discomfort. °· Drink enough water to keep your urine clear or pale yellow. °· Wash your hands thoroughly with soap and water for at least 20 seconds after changing your sanitary pads, after using the toilet, and before holding or feeding your baby. °· If you are not breastfeeding, avoid touching your breasts a lot. Doing this can make your breasts produce more milk. °· If you become weak or lightheaded, or you feel like you might faint, ask for help before: °? Getting out of bed. °? Showering. °· Change your sanitary pads frequently. Watch for any changes in your flow, such as a sudden increase in volume, a change in color, the passing of large blood clots. If you pass a blood clot from your vagina, save it   to show to your health care provider. Do not flush blood clots down the toilet without having your health care provider look at them. °· Make sure that all your vaccinations are up to date. This can help protect you and your baby from getting certain diseases. You may need to have immunizations done before you leave the hospital. °· If desired, talk with your health care provider about methods of family planning or birth control (contraception). °How can I start bonding with my baby? °Spending as much time as  possible with your baby is very important. During this time, you and your baby can get to know each other and develop a bond. Having your baby stay with you in your room (rooming in) can give you time to get to know your baby. Rooming in can also help you become comfortable caring for your baby. Breastfeeding can also help you bond with your baby. °How can I plan for returning home with my baby? °· Make sure that you have a car seat installed in your vehicle. °? Your car seat should be checked by a certified car seat installer to make sure that it is installed safely. °? Make sure that your baby fits into the car seat safely. °· Ask your health care provider any questions you have about caring for yourself or your baby. Make sure that you are able to contact your health care provider with any questions after leaving the hospital. °This information is not intended to replace advice given to you by your health care provider. Make sure you discuss any questions you have with your health care provider. °Document Released: 11/27/2006 Document Revised: 07/05/2015 Document Reviewed: 01/04/2015 °Elsevier Interactive Patient Education © 2018 [employer] ° °

## 2017-05-28 NOTE — Lactation Note (Signed)
Lactation Consultation Note  Patient Name: Brittney Collins ZOXWR'UToday's Date: 05/28/2017   Baby in NICU.  Mother states he may be going home with parents today. Discussed pumping q.25-3 hours. Mother has personal DEBP at home. Reviewed engorgement care.      Maternal Data    Feeding    LATCH Score                   Interventions    Lactation Tools Discussed/Used     Consult Status      [name], [name] 05/28/2017, 9:57 AM

## 2017-05-29 ENCOUNTER — Other Ambulatory Visit: Payer: BLUE CROSS/BLUE SHIELD

## 2017-05-29 ENCOUNTER — Encounter: Payer: BLUE CROSS/BLUE SHIELD | Admitting: Women's Health

## 2017-06-06 ENCOUNTER — Other Ambulatory Visit: Payer: Self-pay

## 2017-06-06 ENCOUNTER — Ambulatory Visit (INDEPENDENT_AMBULATORY_CARE_PROVIDER_SITE_OTHER): Payer: BLUE CROSS/BLUE SHIELD | Admitting: Women's Health

## 2017-06-06 ENCOUNTER — Encounter: Payer: Self-pay | Admitting: Women's Health

## 2017-06-06 NOTE — Progress Notes (Signed)
GYN VISIT Patient name: Brittney Collins MRN [id number]  Date of birth: April 23, 1990 Chief Complaint:   Follow-up (3rd degree lac)  History of Present Illness:   Brittney Collins is a 27 y.o. G16P1001 Caucasian female 11d s/p SVB, being seen today for f/u on 3rd degree laceration. Pt reports it is feeling fine, no problems, normal bm's, stopped stool softener few days ago and bm's still normal. Bottlefeeding. Denies ppd. Plans COCs.      No LMP recorded. Review of Systems:   Pertinent items are noted in HPI Denies fever/chills, dizziness, headaches, visual disturbances, fatigue, shortness of breath, chest pain, abdominal pain, vomiting, abnormal vaginal discharge/itching/odor/irritation, problems with periods, bowel movements, urination, or intercourse unless otherwise stated above.  Pertinent History Reviewed:  Reviewed past medical,surgical, social, obstetrical and family history.  Reviewed problem list, medications and allergies. Physical Assessment:   Vitals:   06/06/17 1104  BP: 112/60  Pulse: 61  Weight: 127 lb (57.6 kg)  Height: 5\' 5"  (1.651 m)  Body mass index is 21.13 kg/m.       Physical Examination:   General appearance: alert, well appearing, and in no distress  Mental status: alert, oriented to person, place, and time  Skin: warm & dry   Cardiovascular: normal heart rate noted  Respiratory: normal respiratory effort, no distress  Abdomen: soft, non-tender   Pelvic: 3rd degree lac healing well, edges approximated  Extremities: no edema   No results found for this or any previous visit (from the past 24 hour(s)).  Assessment & Plan:  1) 11d s/p SVB> bottlefeeding  2) 3rd degree laceration f/u> healing well, resume stool softener if needed  Meds: No orders of the defined types were placed in this encounter.   No orders of the defined types were placed in this encounter.   Return for As scheduled for pp visit.  Cheral Marker CNM,  [facility] 06/06/2017 11:50 AM

## 2017-07-03 ENCOUNTER — Ambulatory Visit (INDEPENDENT_AMBULATORY_CARE_PROVIDER_SITE_OTHER): Payer: BLUE CROSS/BLUE SHIELD | Admitting: Advanced Practice Midwife

## 2017-07-03 ENCOUNTER — Encounter: Payer: Self-pay | Admitting: Advanced Practice Midwife

## 2017-07-03 DIAGNOSIS — Z3202 Encounter for pregnancy test, result negative: Secondary | ICD-10-CM | POA: Diagnosis not present

## 2017-07-03 LAB — POCT URINE PREGNANCY: PREG TEST UR: NEGATIVE

## 2017-07-03 MED ORDER — NORGESTIMATE-ETH ESTRADIOL 0.25-35 MG-MCG PO TABS: 1.0000 | ORAL_TABLET | Freq: Every day | ORAL | 11 refills | Status: DC

## 2017-07-03 NOTE — Progress Notes (Signed)
Brittney Collins is a 27 y.o. who presents for a postpartum visit. She is 5.5 weeks postpartum following a spontaneous vaginal delivery. I have fully reviewed the prenatal and intrapartum course. The delivery was at 40 gestational weeks.  Anesthesia: local. Postpartum course has been uneventful. Baby's course has been uneventful. Baby is feeding by bottle. Bleeding: started period last Wed. Bowel function is normal. Bladder function is normal. Patient is not sexually active. Contraception method is abstinence. Postpartum depression screening: negative.   Current Outpatient Medications:  .  norgestimate-ethinyl estradiol (ORTHO-CYCLEN,SPRINTEC,PREVIFEM) 0.25-35 MG-MCG tablet, Take 1 tablet by mouth daily., Disp: 1 Package, Rfl: 11  Review of Systems   Constitutional: Negative for fever and chills Eyes: Negative for visual disturbances Respiratory: Negative for shortness of breath, dyspnea Cardiovascular: Negative for chest pain or palpitations  Gastrointestinal: Negative for vomiting, diarrhea and constipation Genitourinary: Negative for dysuria and urgency Musculoskeletal: Negative for back pain, joint pain, myalgias  Neurological: Negative for dizziness and headaches    Objective:     Vitals:   07/03/17 1053  BP: 124/70  Pulse: 80   General:  alert, cooperative and no distress   Breasts:  negative  Lungs: Normal repiratory effort  Heart:  regular rate and rhythm  Abdomen: Soft, nontender   Vulva:  normal  Vagina: normal vagina; nearly healed 3rd degree lac  Cervix:  closed  Corpus: Well involuted     Rectal Exam: no hemorrhoids        Assessment:    normalt postpartum exam.  Plan:   1. Contraception: OCP (estrogen/progesterone) 2. Follow up in:   or as needed.

## 2017-07-03 NOTE — Patient Instructions (Addendum)
Start birth control pill this Sunday (07/08/17).  Use condoms for the first pack of pills.   Oral Contraception Information Oral contraceptive pills (OCPs) are medicines taken to prevent pregnancy. OCPs work by preventing the ovaries from releasing eggs. The hormones in OCPs also cause the cervical mucus to thicken, preventing the sperm from entering the uterus. The hormones also cause the uterine lining to become thin, not allowing a fertilized egg to attach to the inside of the uterus. OCPs are highly effective when taken exactly as prescribed. However, OCPs do not prevent sexually transmitted diseases (STDs). Safe sex practices, such as using condoms along with the pill, can help prevent STDs. Before taking the pill, you may have a physical exam and Pap test. Your health care provider may order blood tests. The health care provider will make sure you are a good candidate for oral contraception. Discuss with your health care provider the possible side effects of the OCP you may be prescribed. When starting an OCP, it can take 2 to 3 months for the body to adjust to the changes in hormone levels in your body. Types of oral contraception  The combination pill-This pill contains estrogen and progestin (synthetic progesterone) hormones. The combination pill comes in 21-day, 28-day, or 91-day packs. Some types of combination pills are meant to be taken continuously (365-day pills). With 21-day packs, you do not take pills for 7 days after the last pill. With 28-day packs, the pill is taken every day. The last 7 pills are without hormones. Certain types of pills have more than 21 hormone-containing pills. With 91-day packs, the first 84 pills contain both hormones, and the last 7 pills contain no hormones or contain estrogen only.  The minipill-This pill contains the progesterone hormone only. The pill is taken every day continuously. It is very important to take the pill at the same time each day. The minipill  comes in packs of 28 pills. All 28 pills contain the hormone. Advantages of oral contraceptive pills  Decreases premenstrual symptoms.  Treats menstrual period cramps.  Regulates the menstrual cycle.  Decreases a heavy menstrual flow.  May treatacne, depending on the type of pill.  Treats abnormal uterine bleeding.  Treats polycystic ovarian syndrome.  Treats endometriosis.  Can be used as emergency contraception. Things that can make oral contraceptive pills less effective OCPs can be less effective if:  You forget to take the pill at the same time every day.  You have a stomach or intestinal disease that lessens the absorption of the pill.  You take OCPs with other medicines that make OCPs less effective, such as antibiotics, certain HIV medicines, and some seizure medicines.  You take expired OCPs.  You forget to restart the pill on day 7, when using the packs of 21 pills.  Risks associated with oral contraceptive pills Oral contraceptive pills can sometimes cause side effects, such as:  Headache.  Nausea.  Breast tenderness.  Irregular bleeding or spotting.  Combination pills are also associated with a small increased risk of:  Blood clots.  Heart attack.  Stroke.  This information is not intended to replace advice given to you by your health care provider. Make sure you discuss any questions you have with your health care provider. Document Released: 04/22/2002 Document Revised: 07/08/2015 Document Reviewed: 07/21/2012 Elsevier Interactive Patient Education  [employer].

## 2017-08-02 DIAGNOSIS — L720 Epidermal cyst: Secondary | ICD-10-CM | POA: Diagnosis not present

## 2017-08-02 DIAGNOSIS — Z6820 Body mass index (BMI) 20.0-20.9, adult: Secondary | ICD-10-CM | POA: Diagnosis not present

## 2017-08-02 DIAGNOSIS — D485 Neoplasm of uncertain behavior of skin: Secondary | ICD-10-CM | POA: Diagnosis not present

## 2018-01-01 DIAGNOSIS — Z23 Encounter for immunization: Secondary | ICD-10-CM | POA: Diagnosis not present

## 2018-05-06 DIAGNOSIS — B349 Viral infection, unspecified: Secondary | ICD-10-CM | POA: Diagnosis not present

## 2018-05-06 DIAGNOSIS — R6889 Other general symptoms and signs: Secondary | ICD-10-CM | POA: Diagnosis not present

## 2018-05-06 DIAGNOSIS — J069 Acute upper respiratory infection, unspecified: Secondary | ICD-10-CM | POA: Diagnosis not present

## 2018-05-06 DIAGNOSIS — R05 Cough: Secondary | ICD-10-CM | POA: Diagnosis not present

## 2018-05-06 DIAGNOSIS — Z6820 Body mass index (BMI) 20.0-20.9, adult: Secondary | ICD-10-CM | POA: Diagnosis not present

## 2018-06-20 DIAGNOSIS — Z20828 Contact with and (suspected) exposure to other viral communicable diseases: Secondary | ICD-10-CM | POA: Diagnosis not present

## 2018-12-25 DIAGNOSIS — J069 Acute upper respiratory infection, unspecified: Secondary | ICD-10-CM | POA: Diagnosis not present

## 2019-02-25 ENCOUNTER — Ambulatory Visit: Payer: Self-pay | Attending: Internal Medicine

## 2019-02-25 ENCOUNTER — Other Ambulatory Visit: Payer: Self-pay

## 2019-02-25 DIAGNOSIS — Z20822 Contact with and (suspected) exposure to covid-19: Secondary | ICD-10-CM | POA: Insufficient documentation

## 2019-02-26 LAB — NOVEL CORONAVIRUS, NAA: SARS-CoV-2, NAA: NOT DETECTED

## 2019-04-18 DIAGNOSIS — I781 Nevus, non-neoplastic: Secondary | ICD-10-CM | POA: Diagnosis not present

## 2019-06-16 DIAGNOSIS — Z20828 Contact with and (suspected) exposure to other viral communicable diseases: Secondary | ICD-10-CM | POA: Diagnosis not present

## 2019-07-28 DIAGNOSIS — Z20818 Contact with and (suspected) exposure to other bacterial communicable diseases: Secondary | ICD-10-CM | POA: Diagnosis not present

## 2019-09-08 DIAGNOSIS — S61230A Puncture wound without foreign body of right index finger without damage to nail, initial encounter: Secondary | ICD-10-CM | POA: Diagnosis not present

## 2019-09-23 DIAGNOSIS — Z20822 Contact with and (suspected) exposure to covid-19: Secondary | ICD-10-CM | POA: Diagnosis not present

## 2019-09-23 DIAGNOSIS — Z1152 Encounter for screening for COVID-19: Secondary | ICD-10-CM | POA: Diagnosis not present

## 2019-12-16 DIAGNOSIS — Z20822 Contact with and (suspected) exposure to covid-19: Secondary | ICD-10-CM | POA: Diagnosis not present

## 2019-12-16 DIAGNOSIS — S61230A Puncture wound without foreign body of right index finger without damage to nail, initial encounter: Secondary | ICD-10-CM | POA: Diagnosis not present

## 2021-10-26 DIAGNOSIS — L732 Hidradenitis suppurativa: Secondary | ICD-10-CM | POA: Diagnosis not present

## 2021-10-27 DIAGNOSIS — L732 Hidradenitis suppurativa: Secondary | ICD-10-CM | POA: Diagnosis not present

## 2021-12-21 DIAGNOSIS — N912 Amenorrhea, unspecified: Secondary | ICD-10-CM | POA: Diagnosis not present

## 2022-01-18 ENCOUNTER — Encounter: Payer: Self-pay | Admitting: *Deleted

## 2022-01-18 DIAGNOSIS — Z3490 Encounter for supervision of normal pregnancy, unspecified, unspecified trimester: Secondary | ICD-10-CM | POA: Insufficient documentation

## 2022-01-19 ENCOUNTER — Other Ambulatory Visit: Payer: Self-pay | Admitting: Obstetrics & Gynecology

## 2022-01-19 DIAGNOSIS — O3680X0 Pregnancy with inconclusive fetal viability, not applicable or unspecified: Secondary | ICD-10-CM

## 2022-01-20 ENCOUNTER — Ambulatory Visit (INDEPENDENT_AMBULATORY_CARE_PROVIDER_SITE_OTHER): Payer: BC Managed Care – PPO

## 2022-01-20 ENCOUNTER — Encounter: Payer: Self-pay | Admitting: *Deleted

## 2022-01-20 ENCOUNTER — Ambulatory Visit (INDEPENDENT_AMBULATORY_CARE_PROVIDER_SITE_OTHER): Payer: Self-pay | Admitting: *Deleted

## 2022-01-20 VITALS — BP 124/72 | HR 73 | Wt 138.0 lb

## 2022-01-20 DIAGNOSIS — O3680X0 Pregnancy with inconclusive fetal viability, not applicable or unspecified: Secondary | ICD-10-CM

## 2022-01-20 DIAGNOSIS — Z3A09 9 weeks gestation of pregnancy: Secondary | ICD-10-CM | POA: Diagnosis not present

## 2022-01-20 DIAGNOSIS — Z3480 Encounter for supervision of other normal pregnancy, unspecified trimester: Secondary | ICD-10-CM

## 2022-01-20 NOTE — Progress Notes (Signed)
   INITIAL OB NURSE INTAKE  SUBJECTIVE:  Brittney Collins is a 31 y.o. G23P1001 female Unknown by LMP c/w today's U/S with an Estimated Date of Delivery: None noted. being seen today for her initial OB intake/educational visit with RN. She is taking prenatal vitamins.  She is not having nausea and/or vomiting and does not request nausea meds at this time.  Patient's last menstrual period was 11/18/2021.  Patient's medical, surgical, and obstetrical history obtained and reviewed.  Current medications reviewed.   Patient Active Problem List   Diagnosis Date Noted   Encounter for supervision of normal pregnancy, antepartum 01/18/2022   Right ovarian cyst 02/08/2017   Past Medical History:  Diagnosis Date   Medical history non-contributory    Past Surgical History:  Procedure Laterality Date   WISDOM TOOTH EXTRACTION     OB History     Gravida  2   Para  1   Term  1   Preterm      AB      Living  1      SAB      IAB      Ectopic      Multiple      Live Births  1           OBJECTIVE:  BP 124/72   Pulse 73   Wt 138 lb (62.6 kg)   LMP 11/18/2021   BMI 22.96 kg/m   ASSESSMENT/PLAN: G2P1001 at Unknown with an Estimated Date of Delivery: None noted.  Prenatal vitamins: continue   Nausea medicines: not currently needed   OB packet given: Yes BabyScripts and MyChart activated  Blood Pressure Cuff: has at home. Discussed to be used for virtual visits and home BP checks.  Genetic & carrier screening discussed: requests NT/IT, undecided about Panorama and Horizon  Placed OB Box on problem list and updated Reviewed recommended weight gain based on pre-gravid BMI BMI 18.5-24.9, gain max 25-35lb  Follow-up in 3 weeks for NT U/S & New OB visit with provider  Face-to-face time at least 30 minutes. 50% or more of this visit was spent in counseling and coordination of care.  Debbe Odea Brittney Mimbs RN-C 01/20/2022 11:13 AM

## 2022-01-20 NOTE — Progress Notes (Addendum)
[name] 9 wks,single IUP with yolk sac,CRL 20.85 mm,fetal tachycardia,FHR 195 bpm,subchorionic hemorrhage 5 x .7 x 3.4 cm,normal left ovary,two complex right ovarian cysts with low level echoes (#1) 4.3 x 2.8 x 3.9 cm,(#2) 2.5 x 1.4 x 1.9 cm

## 2022-01-23 ENCOUNTER — Other Ambulatory Visit: Payer: Self-pay

## 2022-01-23 ENCOUNTER — Encounter: Payer: Self-pay | Admitting: *Deleted

## 2022-01-24 ENCOUNTER — Encounter: Payer: Self-pay | Admitting: Advanced Practice Midwife

## 2022-01-24 DIAGNOSIS — Z23 Encounter for immunization: Secondary | ICD-10-CM | POA: Diagnosis not present

## 2022-02-17 ENCOUNTER — Other Ambulatory Visit: Payer: Self-pay | Admitting: Obstetrics & Gynecology

## 2022-02-17 DIAGNOSIS — Z3682 Encounter for antenatal screening for nuchal translucency: Secondary | ICD-10-CM

## 2022-02-20 ENCOUNTER — Encounter: Payer: Self-pay | Admitting: Advanced Practice Midwife

## 2022-02-20 ENCOUNTER — Ambulatory Visit (INDEPENDENT_AMBULATORY_CARE_PROVIDER_SITE_OTHER): Payer: BC Managed Care – PPO

## 2022-02-20 ENCOUNTER — Other Ambulatory Visit (HOSPITAL_COMMUNITY)
Admission: RE | Admit: 2022-02-20 | Discharge: 2022-02-20 | Disposition: A | Discharge status: Home / Self Care | Payer: BC Managed Care – PPO | Source: Ambulatory Visit | Attending: Advanced Practice Midwife | Admitting: Advanced Practice Midwife

## 2022-02-20 ENCOUNTER — Ambulatory Visit (INDEPENDENT_AMBULATORY_CARE_PROVIDER_SITE_OTHER): Payer: BC Managed Care – PPO | Admitting: Advanced Practice Midwife

## 2022-02-20 VITALS — BP 115/64 | HR 63 | Wt 135.6 lb

## 2022-02-20 DIAGNOSIS — O4401 Placenta previa specified as without hemorrhage, first trimester: Secondary | ICD-10-CM

## 2022-02-20 DIAGNOSIS — Z3A13 13 weeks gestation of pregnancy: Secondary | ICD-10-CM | POA: Insufficient documentation

## 2022-02-20 DIAGNOSIS — N83201 Unspecified ovarian cyst, right side: Secondary | ICD-10-CM

## 2022-02-20 DIAGNOSIS — Z3682 Encounter for antenatal screening for nuchal translucency: Secondary | ICD-10-CM

## 2022-02-20 DIAGNOSIS — Z3480 Encounter for supervision of other normal pregnancy, unspecified trimester: Secondary | ICD-10-CM

## 2022-02-20 DIAGNOSIS — Z3481 Encounter for supervision of other normal pregnancy, first trimester: Secondary | ICD-10-CM | POA: Insufficient documentation

## 2022-02-20 DIAGNOSIS — O09299 Supervision of pregnancy with other poor reproductive or obstetric history, unspecified trimester: Secondary | ICD-10-CM

## 2022-02-20 DIAGNOSIS — O4400 Placenta previa specified as without hemorrhage, unspecified trimester: Secondary | ICD-10-CM | POA: Insufficient documentation

## 2022-02-20 NOTE — Progress Notes (Signed)
[name] 13+3 wks,measurements c/w dates,CRL 69.52 mm,FHR 164 bpm,NB present,NT 1.5 mm,normal left ovary,two complex right ovarian cysts with low level echoes (#1) 4 x 3.4 x 4.3 cm,(#2) 2.9 x 1.9 x 1.6 cm,right lateral placenta,early placenta previa will recheck on next ultrasound

## 2022-02-20 NOTE — Progress Notes (Signed)
INITIAL OBSTETRICAL VISIT Patient name: Brittney Collins MRN [id number]  Date of birth: 02-28-1990 Chief Complaint:   Initial Prenatal Visit  History of Present Illness:   Brittney Collins is a 32 y.o. G24P1001 Caucasian female at [redacted]w[redacted]d by LMP c/w u/s at 9 weeks with an Estimated Date of Delivery: 08/25/22 being seen today for her initial obstetrical visit.   Her obstetrical history is significant for pushed (unmedicated!) for nearly 5 hours, SVD 9# 1oz w/3A lac (compound hand).  Had planned a waterbirth, but FHR issues prevented it.  Plans to try again.   Today she reports no complaints.     02/20/2022    9:56 AM  Depression screen PHQ 2/9  Decreased Interest 0  Down, Depressed, Hopeless 0  PHQ - 2 Score 0  Altered sleeping 0  Tired, decreased energy 0  Change in appetite 0  Feeling bad or failure about yourself  0  Trouble concentrating 0  Moving slowly or fidgety/restless 0  Suicidal thoughts 0  PHQ-9 Score 0    Patient's last menstrual period was 11/18/2021. Last pap 2019. Results were: normal Review of Systems:   Pertinent items are noted in HPI Denies cramping/contractions, leakage of fluid, vaginal bleeding, abnormal vaginal discharge w/ itching/odor/irritation, headaches, visual changes, shortness of breath, chest pain, abdominal pain, severe nausea/vomiting, or problems with urination or bowel movements unless otherwise stated above.  Pertinent History Reviewed:  Reviewed past medical,surgical, social, obstetrical and family history.  Reviewed problem list, medications and allergies. OB History  Gravida Para Term Preterm AB Living  2 1 1     1   SAB IAB Ectopic Multiple Live Births          1    # Outcome Date GA Lbr Len/2nd Weight Sex Delivery Anes PTL Lv  2 Current           1 Term 05/26/17 [redacted]w[redacted]d  9 lb 2 oz (4.139 kg) M Vag-Spont None N LIV   Physical Assessment:   Vitals:   02/20/22 1012  BP: 115/64  Pulse: 63  Weight: 135 lb 9.6 oz (61.5 kg)   Body mass index is 22.57 kg/m.       Physical Examination:  General appearance - well appearing, and in no distress  Mental status - alert, oriented to person, place, and time  Psych:  She has a normal mood and affect  Skin - warm and dry, normal color, no suspicious lesions noted  Chest - effort normal  Heart - normal rate and regular rhythm  Abdomen - soft, nontender  Extremities:  No swelling or varicosities noted  Pelvic - VULVA: normal appearing vulva with no masses, tenderness or lesions  VAGINA: normal appearing vagina with normal color and discharge, no lesions  CERVIX: normal appearing cervix without discharge or lesions, no CMT   Thin prep Pap smear w/ HR HPV cotesting  TODAY'S NT  Thin prep pap is   [name] 13+3 wks,measurements c/w dates,CRL 69.52 mm,FHR 164 bpm,NB present,NT 1.5 mm,normal left ovary,two complex right ovarian cysts with low level echoes (#1) 4 x 3.4 x 4.3 cm,(#2) 2.9 x 1.9 x 1.6 cm,right lateral placenta,early placenta previa will recheck on next ultrasound    No results found for this or any previous visit (from the past 24 hour(s)).   Indications for early GDM screening  Previous birth of an infant weighing ?4000 g Yes     02/20/2022    9:56 AM  Depression screen PHQ 2/9  Decreased Interest  0  Down, Depressed, Hopeless 0  PHQ - 2 Score 0  Altered sleeping 0  Tired, decreased energy 0  Change in appetite 0  Feeling bad or failure about yourself  0  Trouble concentrating 0  Moving slowly or fidgety/restless 0  Suicidal thoughts 0  PHQ-9 Score 0        02/20/2022    9:56 AM  GAD 7 : Generalized Anxiety Score  Nervous, Anxious, on Edge 0  Control/stop worrying 0  Worry too much - different things 0  Trouble relaxing 0  Restless 0  Easily annoyed or irritable 0  Afraid - awful might happen 0  Total GAD 7 Score 0      Assessment & Plan:  1) Low-Risk Pregnancy G2P1001 at [redacted]w[redacted]d with an Estimated Date of Delivery: 08/25/22   2) Initial OB  visit  3) Placenta previa:  pelvic rest for now  4)  Ovarian cyst ? Hemorrhagic:  continue to monitor  Meds: No orders of the defined types were placed in this encounter.   Initial labs obtained Continue prenatal vitamins Reviewed n/v relief measures and warning s/s to report Reviewed recommended weight gain based on pre-gravid BMI Encouraged well-balanced diet Genetic & carrier screening discussed: requests Panorama, NT/IT, and Horizon , declines AFP Ultrasound discussed; fetal survey: requested CCNC completed> form faxed if has or is planning to apply for medicaid The nature of [facility] [employer] with multiple MDs and other Advanced Practice Providers was explained to patient; also emphasized that fellows, residents, and students are part of our team. Has home bp cuff.. Check bp weekly, let us know if >140/90.        Brittney Collins 10:41 AM

## 2022-02-20 NOTE — Patient Instructions (Signed)
Brittney Collins, I greatly value your feedback.  If you receive a survey following your visit with [name] today, we appreciate you taking the time to fill it out.  Thanks, Brittney Berthold, DNP, [place]!!! It is now [facility] at [facility] ([place], [place] [id number]) Entrance located off of [address]   Nausea & Vomiting Have saltine crackers or pretzels by your bed and eat a few bites before you raise your head out of bed in the morning Eat small frequent meals throughout the day instead of large meals Drink plenty of fluids throughout the day to stay hydrated, just don't drink a lot of fluids with your meals.  This can make your stomach fill up faster making you feel sick Do not brush your teeth right after you eat Products with real ginger are good for nausea, like ginger ale and ginger hard candy Make sure it says made with real ginger! Sucking on sour candy like lemon heads is also good for nausea If your prenatal vitamins make you nauseated, take them at night so you will sleep through the nausea Sea Bands If you feel like you need medicine for the nausea & vomiting please let us know If you are unable to keep any fluids or food down please let us know   Constipation Drink plenty of fluid, preferably water, throughout the day Eat foods high in fiber such as fruits, vegetables, and grains Exercise, such as walking, is a good way to keep your bowels regular Drink warm fluids, especially warm prune juice, or decaf coffee Eat a 1/2 cup of real oatmeal (not instant), 1/2 cup applesauce, and 1/2-1 cup warm prune juice every day If needed, you may take Colace (docusate sodium) stool softener once or twice a day to help keep the stool soft.  If you still are having problems with constipation, you may take Miralax once daily as needed to help keep your bowels regular.   Home Blood Pressure Monitoring  for Patients   Your provider has recommended that you check your blood pressure (BP) at least once a week at home. If you do not have a blood pressure cuff at home, one will be provided for you. Contact your provider if you have not received your monitor within 1 week.   Helpful Tips for Accurate Home Blood Pressure Checks  Don't smoke, exercise, or drink caffeine 30 minutes before checking your BP Use the restroom before checking your BP (a full bladder can raise your pressure) Relax in a comfortable upright chair Feet on the ground Left arm resting comfortably on a flat surface at the level of your heart Legs uncrossed Back supported Sit quietly and don't talk Place the cuff on your bare arm Adjust snuggly, so that only two fingertips can fit between your skin and the top of the cuff Check 2 readings separated by at least one minute Keep a log of your BP readings For a visual, please reference this diagram: http://ccnc.care/bpdiagram  Provider Name: [facility]     Phone: [phone]  Zone 1: ALL CLEAR  Continue to monitor your symptoms:  BP reading is less than 140 (top number) or less than 90 (bottom number)  No right upper stomach pain No headaches or seeing spots No feeling nauseated or throwing up No swelling in face and hands  Zone 2: CAUTION Call your doctor's office for any of the following:  BP reading is greater than 140 (top number) or greater than 90 (bottom number)  Stomach pain under your ribs in the middle or right side Headaches or seeing spots Feeling nauseated or throwing up Swelling in face and hands  Zone 3: EMERGENCY  Seek immediate medical care if you have any of the following:  BP reading is greater than160 (top number) or greater than 110 (bottom number) Severe headaches not improving with Tylenol Serious difficulty catching your breath Any worsening symptoms from Zone 2    First Trimester of Pregnancy The first trimester of pregnancy is  from week 1 until the end of week 12 (months 1 through 3). A week after a sperm fertilizes an egg, the egg will implant on the wall of the uterus. This embryo will begin to develop into a baby. Genes from you and your partner are forming the baby. The female genes determine whether the baby is a boy or a girl. At 6-8 weeks, the eyes and face are formed, and the heartbeat can be seen on ultrasound. At the end of 12 weeks, all the baby's organs are formed.  Now that you are pregnant, you will want to do everything you can to have a healthy baby. Two of the most important things are to get good prenatal care and to follow your health care provider's instructions. Prenatal care is all the medical care you receive before the baby's birth. This care will help prevent, find, and treat any problems during the pregnancy and childbirth. BODY CHANGES Your body goes through many changes during pregnancy. The changes vary from woman to woman.  You may gain or lose a couple of pounds at first. You may feel sick to your stomach (nauseous) and throw up (vomit). If the vomiting is uncontrollable, call your health care provider. You may tire easily. You may develop headaches that can be relieved by medicines approved by your health care provider. You may urinate more often. Painful urination may mean you have a bladder infection. You may develop heartburn as a result of your pregnancy. You may develop constipation because certain hormones are causing the muscles that push waste through your intestines to slow down. You may develop hemorrhoids or swollen, bulging veins (varicose veins). Your breasts may begin to grow larger and become tender. Your nipples may stick out more, and the tissue that surrounds them (areola) may become darker. Your gums may bleed and may be sensitive to brushing and flossing. Dark spots or blotches (chloasma, mask of pregnancy) may develop on your face. This will likely fade after the baby is  born. Your menstrual periods will stop. You may have a loss of appetite. You may develop cravings for certain kinds of food. You may have changes in your emotions from day to day, such as being excited to be pregnant or being concerned that something may go wrong with the pregnancy and baby. You may have more vivid and strange dreams. You may have changes in your hair. These can include thickening of your hair, rapid growth, and changes in texture. Some women also have hair loss during or after pregnancy, or hair that feels dry or thin. Your hair will most likely return to normal after your baby is born. WHAT TO EXPECT AT YOUR PRENATAL VISITS During a routine prenatal visit: You will be weighed to make sure you and the baby are growing normally. Your blood pressure will be taken. Your abdomen will be measured to track your baby's growth. The fetal  heartbeat will be listened to starting around week 10 or 12 of your pregnancy. Test results from any previous visits will be discussed. Your health care provider may ask you: How you are feeling. If you are feeling the baby move. If you have had any abnormal symptoms, such as leaking fluid, bleeding, severe headaches, or abdominal cramping. If you have any questions. Other tests that may be performed during your first trimester include: Blood tests to find your blood type and to check for the presence of any previous infections. They will also be used to check for low iron levels (anemia) and Rh antibodies. Later in the pregnancy, blood tests for diabetes will be done along with other tests if problems develop. Urine tests to check for infections, diabetes, or protein in the urine. An ultrasound to confirm the proper growth and development of the baby. An amniocentesis to check for possible genetic problems. Fetal screens for spina bifida and Down syndrome. You may need other tests to make sure you and the baby are doing well. HOME CARE  INSTRUCTIONS  Medicines Follow your health care provider's instructions regarding medicine use. Specific medicines may be either safe or unsafe to take during pregnancy. Take your prenatal vitamins as directed. If you develop constipation, try taking a stool softener if your health care provider approves. Diet Eat regular, well-balanced meals. Choose a variety of foods, such as meat or vegetable-based protein, fish, milk and low-fat dairy products, vegetables, fruits, and whole grain breads and cereals. Your health care provider will help you determine the amount of weight gain that is right for you. Avoid raw meat and uncooked cheese. These carry germs that can cause birth defects in the baby. Eating four or five small meals rather than three large meals a day may help relieve nausea and vomiting. If you start to feel nauseous, eating a few soda crackers can be helpful. Drinking liquids between meals instead of during meals also seems to help nausea and vomiting. If you develop constipation, eat more high-fiber foods, such as fresh vegetables or fruit and whole grains. Drink enough fluids to keep your urine clear or pale yellow. Activity and Exercise Exercise only as directed by your health care provider. Exercising will help you: Control your weight. Stay in shape. Be prepared for labor and delivery. Experiencing pain or cramping in the lower abdomen or low back is a good sign that you should stop exercising. Check with your health care provider before continuing normal exercises. Try to avoid standing for long periods of time. Move your legs often if you must stand in one place for a long time. Avoid heavy lifting. Wear low-heeled shoes, and practice good posture. You may continue to have sex unless your health care provider directs you otherwise. Relief of Pain or Discomfort Wear a good support bra for breast tenderness.   Take warm sitz baths to soothe any pain or discomfort caused by  hemorrhoids. Use hemorrhoid cream if your health care provider approves.   Rest with your legs elevated if you have leg cramps or low back pain. If you develop varicose veins in your legs, wear support hose. Elevate your feet for 15 minutes, 3-4 times a day. Limit salt in your diet. Prenatal Care Schedule your prenatal visits by the twelfth week of pregnancy. They are usually scheduled monthly at first, then more often in the last 2 months before delivery. Write down your questions. Take them to your prenatal visits. Keep all your prenatal visits as directed  by your health care provider. Safety Wear your seat belt at all times when driving. Make a list of emergency phone numbers, including numbers for family, friends, the hospital, and police and fire departments. General Tips Ask your health care provider for a referral to a local prenatal education class. Begin classes no later than at the beginning of month 6 of your pregnancy. Ask for help if you have counseling or nutritional needs during pregnancy. Your health care provider can offer advice or refer you to specialists for help with various needs. Do not use hot tubs, steam rooms, or saunas. Do not douche or use tampons or scented sanitary pads. Do not cross your legs for long periods of time. Avoid cat litter boxes and soil used by cats. These carry germs that can cause birth defects in the baby and possibly loss of the fetus by miscarriage or stillbirth. Avoid all smoking, herbs, alcohol, and medicines not prescribed by your health care provider. Chemicals in these affect the formation and growth of the baby. Schedule a dentist appointment. At home, brush your teeth with a soft toothbrush and be gentle when you floss. SEEK MEDICAL CARE IF:  You have dizziness. You have mild pelvic cramps, pelvic pressure, or nagging pain in the abdominal area. You have persistent nausea, vomiting, or diarrhea. You have a bad smelling vaginal  discharge. You have pain with urination. You notice increased swelling in your face, hands, legs, or ankles. SEEK IMMEDIATE MEDICAL CARE IF:  You have a fever. You are leaking fluid from your vagina. You have spotting or bleeding from your vagina. You have severe abdominal cramping or pain. You have rapid weight gain or loss. You vomit blood or material that looks like coffee grounds. You are exposed to [name] measles and have never had them. You are exposed to fifth disease or chickenpox. You develop a severe headache. You have shortness of breath. You have any kind of trauma, such as from a fall or a car accident. Document Released: 01/24/2001 Document Revised: 06/16/2013 Document Reviewed: 12/10/2012 [facility] Patient Information 2015 [place], [place]. This information is not intended to replace advice given to you by your health care provider. Make sure you discuss any questions you have with your health care provider.  Coronavirus (COVID-19) Are you at risk?  Are you at risk for the Coronavirus (COVID-19)?  To be considered HIGH RISK for Coronavirus (COVID-19), you have to meet the following criteria:  Traveled to [place], [place], [place], [place] or [place];  and have fever, cough, and shortness of breath within the last 2 weeks of travel OR Been in close contact with a person diagnosed with COVID-19 within the last 2 weeks and have fever, cough, and shortness of breath IF YOU DO NOT MEET THESE CRITERIA, YOU ARE CONSIDERED LOW RISK FOR COVID-19.  What to do if you are HIGH RISK for COVID-19?  If you are having a medical emergency, call 911. Seek medical care right away. Before you go to a doctor's office, urgent care or emergency department, call ahead and tell them about your recent travel, contact with someone diagnosed with COVID-19, and your symptoms. You should receive instructions from your physician's office regarding next steps of care.  When you arrive at healthcare provider,  tell the healthcare staff immediately you have returned from visiting [place], [place], [place], [place] or [place]; in the last two weeks or you have been in close contact with a person diagnosed with COVID-19 in the last 2 weeks.  Tell the health care staff about your symptoms: fever, cough and shortness of breath. After you have been seen by a medical provider, you will be either: Tested for (COVID-19) and discharged home on quarantine except to seek medical care if symptoms worsen, and asked to  Stay home and avoid contact with others until you get your results (4-5 days)  Avoid travel on public transportation if possible (such as bus, train, or airplane) or Sent to the Emergency Department by EMS for evaluation, COVID-19 testing, and possible admission depending on your condition and test results.  What to do if you are LOW RISK for COVID-19?  Reduce your risk of any infection by using the same precautions used for avoiding the common cold or flu:  Wash your hands often with soap and warm water for at least 20 seconds.  If soap and water are not readily available, use an alcohol-based hand sanitizer with at least 60% alcohol.  If coughing or sneezing, cover your mouth and nose by coughing or sneezing into the elbow areas of your shirt or coat, into a tissue or into your sleeve (not your hands). Avoid shaking hands with others and consider head nods or verbal greetings only. Avoid touching your eyes, nose, or mouth with unwashed hands.  Avoid close contact with people who are sick. Avoid places or events with large numbers of people in one location, like concerts or sporting events. Carefully consider travel plans you have or are making. If you are planning any travel outside or inside the [name], visit the CDC's Travelers' Health webpage for the latest health notices. If you have some symptoms but not all symptoms, continue to monitor at home and seek medical attention if your symptoms worsen. If  you are having a medical emergency, call 911.   ADDITIONAL HEALTHCARE OPTIONS FOR [place] / e-Visit: [email or web address]         [facility]: [facility]: [id number]                   [facility]: [phone]     Safe Medications in Pregnancy   Acne: Benzoyl Peroxide Salicylic Acid  Backache/Headache: Tylenol: 2 regular strength every 4 hours OR              2 Extra strength every 6 hours  Colds/Coughs/Allergies: Benadryl (alcohol free) 25 mg every 6 hours as needed Breath right strips Claritin Cepacol throat lozenges Chloraseptic throat spray Cold-Eeze- up to three times per day Cough drops, alcohol free Flonase (by prescription only) Guaifenesin Mucinex Robitussin DM (plain only, alcohol free) Saline nasal spray/drops Sudafed (pseudoephedrine) & Actifed ** use only after [redacted] weeks gestation and if you do not have high blood pressure Tylenol Vicks Vaporub Zinc lozenges Zyrtec   Constipation: Colace Ducolax suppositories Fleet enema Glycerin suppositories Metamucil Milk of magnesia Miralax Senokot Smooth move tea  Diarrhea: Kaopectate Imodium A-D  *NO pepto Bismol  Hemorrhoids: Anusol Anusol HC Preparation H Tucks  Indigestion: Tums Maalox Mylanta Zantac  Pepcid  Insomnia: Benadryl (alcohol free) 36m every 6 hours as needed Tylenol PM Unisom, no Gelcaps  Leg Cramps: Tums MagGel  Nausea/Vomiting:  Bonine Dramamine Emetrol Ginger extract Sea bands Meclizine  Nausea medication to take during pregnancy:  Unisom (doxylamine succinate 25 mg tablets) Take one tablet daily at bedtime. If symptoms are not adequately controlled, the dose can be increased to a maximum recommended dose of two tablets daily (1/2 tablet in  the morning, 1/2 tablet mid-afternoon and one at bedtime). Vitamin B6 160m tablets. Take one  tablet twice a day (up to 200 mg per day).  Skin Rashes: Aveeno products Benadryl cream or 250mevery 6 hours as needed Calamine Lotion 1% cortisone cream  Yeast infection: Gyne-lotrimin 7 Monistat 7   **If taking multiple medications, please check labels to avoid duplicating the same active ingredients **take medication as directed on the label ** Do not exceed 4000 mg of tylenol in 24 hours **Do not take medications that contain aspirin or ibuprofen

## 2022-02-21 LAB — CYTOLOGY - PAP
Chlamydia: NEGATIVE
Comment: NEGATIVE
Comment: NEGATIVE
Comment: NORMAL
Diagnosis: NEGATIVE
High risk HPV: NEGATIVE
Neisseria Gonorrhea: NEGATIVE

## 2022-02-21 LAB — INTEGRATED 1

## 2022-02-21 LAB — CBC/D/PLT+RPR+RH+ABO+RUBIGG...
Antibody Screen: NEGATIVE
Basos: 0 %
EOS (ABSOLUTE): 0.1 10*3/uL (ref 0.0–0.4)
HIV Screen 4th Generation wRfx: NONREACTIVE
Immature Grans (Abs): 0 10*3/uL (ref 0.0–0.1)
Immature Granulocytes: 0 %
Lymphocytes Absolute: 1.8 10*3/uL (ref 0.7–3.1)
Lymphs: 17 %
MCHC: 35.1 g/dL (ref 31.5–35.7)
MCV: 91 fL (ref 79–97)
Monocytes Absolute: 0.5 10*3/uL (ref 0.1–0.9)
Neutrophils Absolute: 8.2 10*3/uL — ABNORMAL HIGH (ref 1.4–7.0)
Neutrophils: 77 %
RDW: 12.9 % (ref 11.7–15.4)
RPR Ser Ql: NONREACTIVE

## 2022-02-21 LAB — HEMOGLOBIN A1C
Est. average glucose Bld gHb Est-mCnc: 111 mg/dL
Hgb A1c MFr Bld: 5.5 % (ref 4.8–5.6)

## 2022-02-21 LAB — HCV INTERPRETATION

## 2022-02-22 LAB — CBC/D/PLT+RPR+RH+ABO+RUBIGG...
Basophils Absolute: 0 10*3/uL (ref 0.0–0.2)
Eos: 1 %
HCV Ab: NONREACTIVE
Hematocrit: 42.5 % (ref 34.0–46.6)
Hemoglobin: 14.9 g/dL (ref 11.1–15.9)
Hepatitis B Surface Ag: NEGATIVE
MCH: 31.8 pg (ref 26.6–33.0)
Monocytes: 5 %
Platelets: 337 10*3/uL (ref 150–450)
RBC: 4.68 x10E6/uL (ref 3.77–5.28)
Rh Factor: POSITIVE
Rubella Antibodies, IGG: 5.01 index (ref >0.99)
WBC: 10.7 10*3/uL (ref 3.4–10.8)

## 2022-02-22 LAB — INTEGRATED 1
Gest. Age on Collection Date: 13 weeks
Maternal Age at EDD: 32 yr
Nuchal Translucency (NT): 1.5 mm
Weight: 136 [lb_av]

## 2022-02-22 LAB — URINE CULTURE

## 2022-02-27 LAB — PANORAMA PRENATAL TEST FULL PANEL:PANORAMA TEST PLUS 5 ADDITIONAL MICRODELETIONS: FETAL FRACTION: 9.8

## 2022-03-22 ENCOUNTER — Ambulatory Visit (INDEPENDENT_AMBULATORY_CARE_PROVIDER_SITE_OTHER): Payer: BC Managed Care – PPO | Admitting: Advanced Practice Midwife

## 2022-03-22 VITALS — BP 127/73 | HR 61 | Wt 138.0 lb

## 2022-03-22 DIAGNOSIS — Z3A17 17 weeks gestation of pregnancy: Secondary | ICD-10-CM | POA: Diagnosis not present

## 2022-03-22 DIAGNOSIS — Z3482 Encounter for supervision of other normal pregnancy, second trimester: Secondary | ICD-10-CM

## 2022-03-22 DIAGNOSIS — Z3480 Encounter for supervision of other normal pregnancy, unspecified trimester: Secondary | ICD-10-CM | POA: Diagnosis not present

## 2022-03-22 DIAGNOSIS — Z363 Encounter for antenatal screening for malformations: Secondary | ICD-10-CM

## 2022-03-22 DIAGNOSIS — N83201 Unspecified ovarian cyst, right side: Secondary | ICD-10-CM

## 2022-03-22 NOTE — Patient Instructions (Signed)
Teandra, thank you for choosing our office today! We appreciate the opportunity to meet your healthcare needs. You may receive a short survey by mail, e-mail, or through MyChart. If you are happy with your care we would appreciate if you could take just a few minutes to complete the survey questions. We read all of your comments and take your feedback very seriously. Thank you again for choosing our office.  [facility] Team at [facility] ([address] [place], [place] [address]) Entrance C, located off of [address] Free 24/7 valet parking  Go to [email or web address] to register for FREE online childbirth classes  Call the office ([phone]) or go to [facility] if: You begin to severe cramping Your water breaks.  Sometimes it is a big gush of fluid, sometimes it is just a trickle that keeps getting your panties wet or running down your legs You have vaginal bleeding.  It is normal to have a small amount of spotting if your cervix was checked.   [place] Pediatricians/Family Doctors [facility] ([facility]): [address], [phone]           [facility]: [address], [phone]                [facility] ([facility]): [address], [phone] (call to ask if accepting patients) [facility]: [address], [place], [phone]    [place] Pediatricians/Family Doctors [facility] ([facility]): [address], [phone] [facility]: [address], [phone] [facility]: [address], [phone]  [place] [facility]  [facility] ([facility]): [phone] [facility]: [address], [phone]   [place] [facility]: [address], [phone]  [place] [facility]  [facility]: [address], [phone]  Home Blood Pressure Monitoring for Patients   Your provider has recommended that you check your blood pressure (BP) at least once a week at home. If you do not have a blood pressure cuff at home, one will be provided for you. Contact your provider if you have not received your monitor within 1 week.   Helpful Tips for Accurate Home Blood Pressure Checks  Don't smoke, exercise, or drink caffeine 30 minutes before checking your BP Use the restroom before checking your BP (a full bladder can raise your pressure) Relax in a comfortable upright chair Feet on the ground Left arm resting comfortably on a flat surface at the level of your heart Legs uncrossed Back supported Sit quietly and don't talk Place the cuff on your bare arm Adjust snuggly, so that only two fingertips can fit between your skin and the top of the cuff Check 2 readings separated by at least one minute Keep a log of your BP readings For a visual, please reference this diagram: http://ccnc.care/bpdiagram  Provider Name: [facility]     Phone: [phone]  Zone 1: ALL CLEAR  Continue to monitor your symptoms:  BP reading is less than 140 (top number) or less than 90 (bottom number)  No right upper stomach pain No headaches or seeing spots No feeling nauseated or throwing up No swelling in face and hands  Zone 2: CAUTION Call your doctor's office for any of the following:  BP reading is greater than 140 (top number) or greater than   90 (bottom number)  Stomach pain under your ribs in the middle or right side Headaches or seeing spots Feeling nauseated or throwing up Swelling in face and hands  Zone 3: EMERGENCY  Seek immediate medical care if you have any of the following:  BP reading is greater than160 (top number) or greater than 110 (bottom number) Severe headaches not improving with Tylenol Serious difficulty catching your breath Any worsening symptoms from  Zone 2     Second Trimester of Pregnancy The second trimester is from week 14 through week 27 (months 4 through 6). The second trimester is often a time when you feel your best. Your body has adjusted to being pregnant, and you begin to feel better physically. Usually, morning sickness has lessened or quit completely, you may have more energy, and you may have an increase in appetite. The second trimester is also a time when the fetus is growing rapidly. At the end of the sixth month, the fetus is about 9 inches long and weighs about 1 pounds. You will likely begin to feel the baby move (quickening) between 16 and 20 weeks of pregnancy. Body changes during your second trimester Your body continues to go through many changes during your second trimester. The changes vary from woman to woman. Your weight will continue to increase. You will notice your lower abdomen bulging out. You may begin to get stretch marks on your hips, abdomen, and breasts. You may develop headaches that can be relieved by medicines. The medicines should be approved by your health care provider. You may urinate more often because the fetus is pressing on your bladder. You may develop or continue to have heartburn as a result of your pregnancy. You may develop constipation because certain hormones are causing the muscles that push waste through your intestines to slow down. You may develop hemorrhoids or swollen, bulging veins (varicose veins). You may have back pain. This is caused by: Weight gain. Pregnancy hormones that are relaxing the joints in your pelvis. A shift in weight and the muscles that support your balance. Your breasts will continue to grow and they will continue to become tender. Your gums may bleed and may be sensitive to brushing and flossing. Dark spots or blotches (chloasma, mask of pregnancy) may develop on your face. This will likely fade after the baby is born. A dark line from your belly button to  the pubic area (linea nigra) may appear. This will likely fade after the baby is born. You may have changes in your hair. These can include thickening of your hair, rapid growth, and changes in texture. Some women also have hair loss during or after pregnancy, or hair that feels dry or thin. Your hair will most likely return to normal after your baby is born.  What to expect at prenatal visits During a routine prenatal visit: You will be weighed to make sure you and the fetus are growing normally. Your blood pressure will be taken. Your abdomen will be measured to track your baby's growth. The fetal heartbeat will be listened to. Any test results from the previous visit will be discussed.  Your health care provider may ask you: How you are feeling. If you are feeling the baby move. If you have had any abnormal symptoms, such as leaking fluid, bleeding, severe headaches, or abdominal cramping. If you are using any tobacco products, including cigarettes, chewing tobacco, and electronic cigarettes. If you have any questions.  Other tests that may be performed during  your second trimester include: Blood tests that check for: Low iron levels (anemia). High blood sugar that affects pregnant women (gestational diabetes) between 41 and 28 weeks. Rh antibodies. This is to check for a protein on red blood cells (Rh factor). Urine tests to check for infections, diabetes, or protein in the urine. An ultrasound to confirm the proper growth and development of the baby. An amniocentesis to check for possible genetic problems. Fetal screens for spina bifida and Down syndrome. HIV (human immunodeficiency virus) testing. Routine prenatal testing includes screening for HIV, unless you choose not to have this test.  Follow these instructions at home: Medicines Follow your health care provider's instructions regarding medicine use. Specific medicines may be either safe or unsafe to take during  pregnancy. Take a prenatal vitamin that contains at least 600 micrograms (mcg) of folic acid. If you develop constipation, try taking a stool softener if your health care provider approves. Eating and drinking Eat a balanced diet that includes fresh fruits and vegetables, whole grains, good sources of protein such as meat, eggs, or tofu, and low-fat dairy. Your health care provider will help you determine the amount of weight gain that is right for you. Avoid raw meat and uncooked cheese. These carry germs that can cause birth defects in the baby. If you have low calcium intake from food, talk to your health care provider about whether you should take a daily calcium supplement. Limit foods that are high in fat and processed sugars, such as fried and sweet foods. To prevent constipation: Drink enough fluid to keep your urine clear or pale yellow. Eat foods that are high in fiber, such as fresh fruits and vegetables, whole grains, and beans. Activity Exercise only as directed by your health care provider. Most women can continue their usual exercise routine during pregnancy. Try to exercise for 30 minutes at least 5 days a week. Stop exercising if you experience uterine contractions. Avoid heavy lifting, wear low heel shoes, and practice good posture. A sexual relationship may be continued unless your health care provider directs you otherwise. Relieving pain and discomfort Wear a good support bra to prevent discomfort from breast tenderness. Take warm sitz baths to soothe any pain or discomfort caused by hemorrhoids. Use hemorrhoid cream if your health care provider approves. Rest with your legs elevated if you have leg cramps or low back pain. If you develop varicose veins, wear support hose. Elevate your feet for 15 minutes, 3-4 times a day. Limit salt in your diet. Prenatal Care Write down your questions. Take them to your prenatal visits. Keep all your prenatal visits as told by your health  care provider. This is important. Safety Wear your seat belt at all times when driving. Make a list of emergency phone numbers, including numbers for family, friends, the hospital, and police and fire departments. General instructions Ask your health care provider for a referral to a local prenatal education class. Begin classes no later than the beginning of month 6 of your pregnancy. Ask for help if you have counseling or nutritional needs during pregnancy. Your health care provider can offer advice or refer you to specialists for help with various needs. Do not use hot tubs, steam rooms, or saunas. Do not douche or use tampons or scented sanitary pads. Do not cross your legs for long periods of time. Avoid cat litter boxes and soil used by cats. These carry germs that can cause birth defects in the baby and possibly loss of the  fetus by miscarriage or stillbirth. Avoid all smoking, herbs, alcohol, and unprescribed drugs. Chemicals in these products can affect the formation and growth of the baby. Do not use any products that contain nicotine or tobacco, such as cigarettes and e-cigarettes. If you need help quitting, ask your health care provider. Visit your dentist if you have not gone yet during your pregnancy. Use a soft toothbrush to brush your teeth and be gentle when you floss. Contact a health care provider if: You have dizziness. You have mild pelvic cramps, pelvic pressure, or nagging pain in the abdominal area. You have persistent nausea, vomiting, or diarrhea. You have a bad smelling vaginal discharge. You have pain when you urinate. Get help right away if: You have a fever. You are leaking fluid from your vagina. You have spotting or bleeding from your vagina. You have severe abdominal cramping or pain. You have rapid weight gain or weight loss. You have shortness of breath with chest pain. You notice sudden or extreme swelling of your face, hands, ankles, feet, or legs. You  have not felt your baby move in over an hour. You have severe headaches that do not go away when you take medicine. You have vision changes. Summary The second trimester is from week 14 through week 27 (months 4 through 6). It is also a time when the fetus is growing rapidly. Your body goes through many changes during pregnancy. The changes vary from woman to woman. Avoid all smoking, herbs, alcohol, and unprescribed drugs. These chemicals affect the formation and growth your baby. Do not use any tobacco products, such as cigarettes, chewing tobacco, and e-cigarettes. If you need help quitting, ask your health care provider. Contact your health care provider if you have any questions. Keep all prenatal visits as told by your health care provider. This is important. This information is not intended to replace advice given to you by your health care provider. Make sure you discuss any questions you have with your health care provider. Document Released: 01/24/2001 Document Revised: 07/08/2015 Document Reviewed: 04/02/2012 Elsevier Interactive Patient Education  2017 [employer].

## 2022-03-22 NOTE — Progress Notes (Signed)
   LOW-RISK PREGNANCY VISIT Patient name: Brittney Collins MRN [id number]  Date of birth: [birth year] Chief Complaint:   Routine Prenatal Visit  History of Present Illness:   Brittney Collins is a 32 y.o. G2P1001 female at [redacted]w[redacted]d with an Estimated Date of Delivery: 08/25/22 being seen today for ongoing management of a low-risk pregnancy.  Today she reports no complaints. Contractions: Not present. Vag. Bleeding: None.  Movement: Absent. denies leaking of fluid. Review of Systems:   Pertinent items are noted in HPI Denies abnormal vaginal discharge w/ itching/odor/irritation, headaches, visual changes, shortness of breath, chest pain, abdominal pain, severe nausea/vomiting, or problems with urination or bowel movements unless otherwise stated above. Pertinent History Reviewed:  Reviewed past medical,surgical, social, obstetrical and family history.  Reviewed problem list, medications and allergies. Physical Assessment:   Vitals:   03/22/22 0835  BP: 127/73  Pulse: 61  Weight: 138 lb (62.6 kg)  Body mass index is 22.96 kg/m.        Physical Examination:   General appearance: Well appearing, and in no distress  Mental status: Alert, oriented to person, place, and time  Skin: Warm & dry  Cardiovascular: Normal heart rate noted  Respiratory: Normal respiratory effort, no distress  Abdomen: Soft, gravid, nontender  Pelvic: Cervical exam deferred         Extremities:    Fetal Status: Fetal Heart Rate (bpm): 152   Movement: Absent    No results found for this or any previous visit (from the past 24 hour(s)).  Assessment & Plan:  1) Low-risk pregnancy G2P1001 at [redacted]w[redacted]d with an Estimated Date of Delivery: 08/25/22   2) Early placenta previa, still on pelvic rest; will f/u @ next visit   Meds: No orders of the defined types were placed in this encounter.  Labs/procedures today: 2nd IT  Plan:  Continue routine obstetrical care   Reviewed: Preterm labor symptoms and general  obstetric precautions including but not limited to vaginal bleeding, contractions, leaking of fluid and fetal movement were reviewed in detail with the patient.  All questions were answered. Has home bp cuff. Check bp weekly, let us know if >140/90.   Follow-up: Return for 2-3wk LROB w anatomy u/s.  Orders Placed This Encounter  Procedures   US OB Comp + 14 Wk   [facility] 03/22/2022 8:52 AM

## 2022-03-24 LAB — INTEGRATED 2
AFP MoM: 0.63
Alpha-Fetoprotein: 25.3 ng/mL
Crown Rump Length: 69.5 mm
DIA MoM: 1.9
DIA Value: 304.4 pg/mL
Estriol, Unconjugated: 1.34 ng/mL
Gest. Age on Collection Date: 13 weeks
Gestational Age: 17.3 weeks
Maternal Age at EDD: 32 yr
Nuchal Translucency (NT): 1.5 mm
Nuchal Translucency MoM: 0.95
Number of Fetuses: 1
PAPP-A MoM: 1.8
PAPP-A Value: 2480.4 ng/mL
Test Results:: NEGATIVE
Weight: 136 [lb_av]
Weight: 136 [lb_av]
hCG MoM: 1.47
hCG Value: 45.2 IU/mL
uE3 MoM: 1.06

## 2022-04-10 ENCOUNTER — Encounter: Payer: Self-pay | Admitting: Women's Health

## 2022-04-10 ENCOUNTER — Ambulatory Visit (INDEPENDENT_AMBULATORY_CARE_PROVIDER_SITE_OTHER): Payer: BC Managed Care – PPO

## 2022-04-10 ENCOUNTER — Ambulatory Visit (INDEPENDENT_AMBULATORY_CARE_PROVIDER_SITE_OTHER): Payer: BC Managed Care – PPO | Admitting: Women's Health

## 2022-04-10 VITALS — BP 132/71 | HR 64 | Wt 142.0 lb

## 2022-04-10 DIAGNOSIS — Z3A20 20 weeks gestation of pregnancy: Secondary | ICD-10-CM

## 2022-04-10 DIAGNOSIS — Z363 Encounter for antenatal screening for malformations: Secondary | ICD-10-CM

## 2022-04-10 DIAGNOSIS — N83201 Unspecified ovarian cyst, right side: Secondary | ICD-10-CM

## 2022-04-10 DIAGNOSIS — Z3482 Encounter for supervision of other normal pregnancy, second trimester: Secondary | ICD-10-CM

## 2022-04-10 DIAGNOSIS — O4402 Placenta previa specified as without hemorrhage, second trimester: Secondary | ICD-10-CM

## 2022-04-10 DIAGNOSIS — Z3480 Encounter for supervision of other normal pregnancy, unspecified trimester: Secondary | ICD-10-CM

## 2022-04-10 NOTE — Progress Notes (Signed)
LOW-RISK PREGNANCY VISIT Patient name: Brittney Collins MRN [id number]  Date of birth: 10-23-1990 Chief Complaint:   Routine Prenatal Visit  History of Present Illness:   Brittney Collins is a 32 y.o. G2P1001 female at 21w3dwith an Estimated Date of Delivery: 08/25/22 being seen today for ongoing management of a low-risk pregnancy.   Today she reports no complaints. Contractions: Not present. Vag. Bleeding: None.  Movement: Present. denies leaking of fluid.     02/20/2022    9:56 AM  Depression screen PHQ 2/9  Decreased Interest 0  Down, Depressed, Hopeless 0  PHQ - 2 Score 0  Altered sleeping 0  Tired, decreased energy 0  Change in appetite 0  Feeling bad or failure about yourself  0  Trouble concentrating 0  Moving slowly or fidgety/restless 0  Suicidal thoughts 0  PHQ-9 Score 0        02/20/2022    9:56 AM  GAD 7 : Generalized Anxiety Score  Nervous, Anxious, on Edge 0  Control/stop worrying 0  Worry too much - different things 0  Trouble relaxing 0  Restless 0  Easily annoyed or irritable 0  Afraid - awful might happen 0  Total GAD 7 Score 0      Review of Systems:   Pertinent items are noted in HPI Denies abnormal vaginal discharge w/ itching/odor/irritation, headaches, visual changes, shortness of breath, chest pain, abdominal pain, severe nausea/vomiting, or problems with urination or bowel movements unless otherwise stated above. Pertinent History Reviewed:  Reviewed past medical,surgical, social, obstetrical and family history.  Reviewed problem list, medications and allergies. Physical Assessment:   Vitals:   04/10/22 0903  BP: 132/71  Pulse: 64  Weight: 142 lb (64.4 kg)  Body mass index is 23.63 kg/m.        Physical Examination:   General appearance: Well appearing, and in no distress  Mental status: Alert, oriented to person, place, and time  Skin: Warm & dry  Cardiovascular: Normal heart rate noted  Respiratory: Normal respiratory  effort, no distress  Abdomen: Soft, gravid, nontender  Pelvic: Cervical exam deferred         Extremities:    Fetal Status: Fetal Heart Rate (bpm): 152 u/s   Movement: Present  [id number],cephalic,right lateral placenta gr 0,normal left ovary,complex hemorrhagic unilocular right ovarian cyst 3.6 x 3.6 x 2.9 cm,SVP of fluid 6.8 cm,FHR 152 bpm,EFW 363 g 52%,anatomy complete,no obvious abnormalities   Chaperone: N/A   No results found for this or any previous visit (from the past 24 hour(s)).  Assessment & Plan:  1) Low-risk pregnancy G2P1001 at 265w3dith an Estimated Date of Delivery: 08/25/22   2) Rt ovarian cyst, 3.6cm  3) Resolved placenta previa> now Rt lateral  4) Plans waterbirth> took class 2019 pregnancy, plans to retake as a refresher  5) H/O macrosomic infant w/ 3rd deg lac> EFW 52% today, plan repeat @ 36-38wks   Meds: No orders of the defined types were placed in this encounter.  Labs/procedures today: U/S  Plan:  Continue routine obstetrical care  Next visit: prefers in person    Reviewed: Preterm labor symptoms and general obstetric precautions including but not limited to vaginal bleeding, contractions, leaking of fluid and fetal movement were reviewed in detail with the patient.  All questions were answered. Does have home bp cuff. Office bp cuff given: not applicable. Check bp weekly, let usKoreanow if consistently >140 and/or >90.  Follow-up: Return in about 4  weeks (around 05/08/2022) for LROB, CNM, in person or online.  Future Appointments  Date Time Provider [place]  05/11/2022  8:30 AM Cresenzo-Dishmon, Joaquim Lai, CNM [facility]    No orders of the defined types were placed in this encounter.  Brittney Collins CNM, [facility] 04/10/2022 9:35 AM

## 2022-04-10 NOTE — Patient Instructions (Signed)
Mendel Ryder, thank you for choosing our office today! We appreciate the opportunity to meet your healthcare needs. You may receive a short survey by mail, e-mail, or through [employer]. If you are happy with your care we would appreciate if you could take just a few minutes to complete the survey questions. We read all of your comments and take your feedback very seriously. Thank you again for choosing our office.  [facility] [employer] Team at [place] [facility] ([place], [place] [address]) Entrance C, located off of [address] parking  Go to [email or web address] to register for FREE online childbirth classes  Call the office [phone]) or go to [facility] if: You begin to severe cramping Your water breaks.  Sometimes it is a big gush of fluid, sometimes it is just a trickle that keeps getting your panties wet or running down your legs You have vaginal bleeding.  It is normal to have a small amount of spotting if your cervix was checked.   [facility] Pediatricians/Family Doctors [facility]): [address] [place], [employer] [facility]: [address] [place], [phone]                [place]): [place], [phone] (call to ask if accepting patients) [facility] Department: [address], [place], [place] Pediatricians/Family Doctors [facility]): [place] [place], [address], [facility] [facility]: [address] [place], [place] [facility]: [place], [place] [facility]): [phone] [facility]: [address] [place]: [address] [place] [facility]: [phone], [phone]  Home Blood Pressure Monitoring for Patients   Your provider has recommended that you check your blood pressure (BP) at least once a week at home. If you do not have a blood pressure cuff at home, one will be provided for you. Contact your provider if you have not received your monitor within 1 week.   Helpful Tips for Accurate Home Blood Pressure Checks  Don't smoke, exercise, or drink caffeine 30 minutes before checking your BP Use the restroom before checking your BP (a full bladder can raise your pressure) Relax in a comfortable upright chair Feet on the ground Left arm resting comfortably on a flat surface at the level of your heart Legs uncrossed Back supported Sit quietly and don't talk Place the cuff on your bare arm Adjust snuggly, so that only two fingertips can fit between your skin and the top of the cuff Check 2 readings separated by at least one minute Keep a log of your BP readings For a visual, please reference this diagram: http://ccnc.care/bpdiagram  Provider Name: [facility]     Phone: [phone]  Zone 1: ALL CLEAR  Continue to monitor your symptoms:  BP reading is less than 140 (top number) or less than 90 (bottom number)  No right upper stomach pain No headaches or seeing spots No feeling nauseated or throwing up No swelling in face and hands  Zone 2: CAUTION Call your doctor's office for any of the following:  BP reading is greater than 140 (top number) or greater than  90 (bottom number)  Stomach pain under your ribs in the middle or right side Headaches or seeing spots Feeling nauseated or throwing up Swelling in face and hands  Zone 3: EMERGENCY  Seek immediate medical care if you have any of the following:  BP reading is greater than160 (top number) or greater than 110 (bottom number) Severe headaches not improving with Tylenol Serious difficulty catching your breath Any worsening symptoms from  Zone 2     Second Trimester of Pregnancy The second trimester is from week 14 through week 27 (months 4 through 6). The second trimester is often a time when you feel your best. Your body has adjusted to being pregnant, and you begin to feel better physically. Usually, morning sickness has lessened or quit completely, you may have more energy, and you may have an increase in appetite. The second trimester is also a time when the fetus is growing rapidly. At the end of the sixth month, the fetus is about 9 inches long and weighs about 1 pounds. You will likely begin to feel the baby move (quickening) between 16 and 20 weeks of pregnancy. Body changes during your second trimester Your body continues to go through many changes during your second trimester. The changes vary from woman to woman. Your weight will continue to increase. You will notice your lower abdomen bulging out. You may begin to get stretch marks on your hips, abdomen, and breasts. You may develop headaches that can be relieved by medicines. The medicines should be approved by your health care provider. You may urinate more often because the fetus is pressing on your bladder. You may develop or continue to have heartburn as a result of your pregnancy. You may develop constipation because certain hormones are causing the muscles that push waste through your intestines to slow down. You may develop hemorrhoids or swollen, bulging veins (varicose veins). You may have back pain. This is caused by: Weight gain. Pregnancy hormones that are relaxing the joints in your pelvis. A shift in weight and the muscles that support your balance. Your breasts will continue to grow and they will continue to become tender. Your gums may bleed and may be sensitive to brushing and flossing. Dark spots or blotches (chloasma, mask of pregnancy) may develop on your face. This will likely fade after the baby is born. A dark line from your belly button to  the pubic area (linea nigra) may appear. This will likely fade after the baby is born. You may have changes in your hair. These can include thickening of your hair, rapid growth, and changes in texture. Some women also have hair loss during or after pregnancy, or hair that feels dry or thin. Your hair will most likely return to normal after your baby is born.  What to expect at prenatal visits During a routine prenatal visit: You will be weighed to make sure you and the fetus are growing normally. Your blood pressure will be taken. Your abdomen will be measured to track your baby's growth. The fetal heartbeat will be listened to. Any test results from the previous visit will be discussed.  Your health care provider may ask you: How you are feeling. If you are feeling the baby move. If you have had any abnormal symptoms, such as leaking fluid, bleeding, severe headaches, or abdominal cramping. If you are using any tobacco products, including cigarettes, chewing tobacco, and electronic cigarettes. If you have any questions.  Other tests that may be performed during   your second trimester include: Blood tests that check for: Low iron levels (anemia). High blood sugar that affects pregnant women (gestational diabetes) between 24 and 28 weeks. Rh antibodies. This is to check for a protein on red blood cells (Rh factor). Urine tests to check for infections, diabetes, or protein in the urine. An ultrasound to confirm the proper growth and development of the baby. An amniocentesis to check for possible genetic problems. Fetal screens for spina bifida and Down syndrome. HIV (human immunodeficiency virus) testing. Routine prenatal testing includes screening for HIV, unless you choose not to have this test.  Follow these instructions at home: Medicines Follow your health care provider's instructions regarding medicine use. Specific medicines may be either safe or unsafe to take during  pregnancy. Take a prenatal vitamin that contains at least 600 micrograms (mcg) of folic acid. If you develop constipation, try taking a stool softener if your health care provider approves. Eating and drinking Eat a balanced diet that includes fresh fruits and vegetables, whole grains, good sources of protein such as meat, eggs, or tofu, and low-fat dairy. Your health care provider will help you determine the amount of weight gain that is right for you. Avoid raw meat and uncooked cheese. These carry germs that can cause birth defects in the baby. If you have low calcium intake from food, talk to your health care provider about whether you should take a daily calcium supplement. Limit foods that are high in fat and processed sugars, such as fried and sweet foods. To prevent constipation: Drink enough fluid to keep your urine clear or pale yellow. Eat foods that are high in fiber, such as fresh fruits and vegetables, whole grains, and beans. Activity Exercise only as directed by your health care provider. Most women can continue their usual exercise routine during pregnancy. Try to exercise for 30 minutes at least 5 days a week. Stop exercising if you experience uterine contractions. Avoid heavy lifting, wear low heel shoes, and practice good posture. A sexual relationship may be continued unless your health care provider directs you otherwise. Relieving pain and discomfort Wear a good support bra to prevent discomfort from breast tenderness. Take warm sitz baths to soothe any pain or discomfort caused by hemorrhoids. Use hemorrhoid cream if your health care provider approves. Rest with your legs elevated if you have leg cramps or low back pain. If you develop varicose veins, wear support hose. Elevate your feet for 15 minutes, 3-4 times a day. Limit salt in your diet. Prenatal Care Write down your questions. Take them to your prenatal visits. Keep all your prenatal visits as told by your health  care provider. This is important. Safety Wear your seat belt at all times when driving. Make a list of emergency phone numbers, including numbers for family, friends, the hospital, and police and fire departments. General instructions Ask your health care provider for a referral to a local prenatal education class. Begin classes no later than the beginning of month 6 of your pregnancy. Ask for help if you have counseling or nutritional needs during pregnancy. Your health care provider can offer advice or refer you to specialists for help with various needs. Do not use hot tubs, steam rooms, or saunas. Do not douche or use tampons or scented sanitary pads. Do not cross your legs for long periods of time. Avoid cat litter boxes and soil used by cats. These carry germs that can cause birth defects in the baby and possibly loss of the   fetus by miscarriage or stillbirth. Avoid all smoking, herbs, alcohol, and unprescribed drugs. Chemicals in these products can affect the formation and growth of the baby. Do not use any products that contain nicotine or tobacco, such as cigarettes and e-cigarettes. If you need help quitting, ask your health care provider. Visit your dentist if you have not gone yet during your pregnancy. Use a soft toothbrush to brush your teeth and be gentle when you floss. Contact a health care provider if: You have dizziness. You have mild pelvic cramps, pelvic pressure, or nagging pain in the abdominal area. You have persistent nausea, vomiting, or diarrhea. You have a bad smelling vaginal discharge. You have pain when you urinate. Get help right away if: You have a fever. You are leaking fluid from your vagina. You have spotting or bleeding from your vagina. You have severe abdominal cramping or pain. You have rapid weight gain or weight loss. You have shortness of breath with chest pain. You notice sudden or extreme swelling of your face, hands, ankles, feet, or legs. You  have not felt your baby move in over an hour. You have severe headaches that do not go away when you take medicine. You have vision changes. Summary The second trimester is from week 14 through week 27 (months 4 through 6). It is also a time when the fetus is growing rapidly. Your body goes through many changes during pregnancy. The changes vary from woman to woman. Avoid all smoking, herbs, alcohol, and unprescribed drugs. These chemicals affect the formation and growth your baby. Do not use any tobacco products, such as cigarettes, chewing tobacco, and e-cigarettes. If you need help quitting, ask your health care provider. Contact your health care provider if you have any questions. Keep all prenatal visits as told by your health care provider. This is important. This information is not intended to replace advice given to you by your health care provider. Make sure you discuss any questions you have with your health care provider. Document Released: 01/24/2001 Document Revised: 07/08/2015 Document Reviewed: 04/02/2012 Elsevier Interactive Patient Education  2017 Elsevier Inc.  

## 2022-04-10 NOTE — Progress Notes (Signed)
[name] [id number] wks,cephalic,right lateral placenta gr 0,normal left ovary,complex hemorrhagic unilocular right ovarian cyst 3.6 x 3.6 x 2.9 cm,SVP of fluid 6.8 cm,FHR 152 bpm,EFW 363 g 52%,anatomy complete,no obvious abnormalities

## 2022-05-11 ENCOUNTER — Encounter: Payer: Self-pay | Admitting: Advanced Practice Midwife

## 2022-05-11 ENCOUNTER — Ambulatory Visit (INDEPENDENT_AMBULATORY_CARE_PROVIDER_SITE_OTHER): Payer: BC Managed Care – PPO | Admitting: Advanced Practice Midwife

## 2022-05-11 VITALS — BP 111/70 | HR 66 | Wt 148.0 lb

## 2022-05-11 DIAGNOSIS — Z3480 Encounter for supervision of other normal pregnancy, unspecified trimester: Secondary | ICD-10-CM

## 2022-05-11 DIAGNOSIS — Z3A24 24 weeks gestation of pregnancy: Secondary | ICD-10-CM

## 2022-05-11 DIAGNOSIS — Z3482 Encounter for supervision of other normal pregnancy, second trimester: Secondary | ICD-10-CM

## 2022-05-11 NOTE — Progress Notes (Signed)
   LOW-RISK PREGNANCY VISIT Patient name: Brittney Collins MRN [id number]  Date of birth: [birth year] Chief Complaint:   Routine Prenatal Visit  History of Present Illness:   Brittney Collins is a 32 y.o. G2P1001 female at [redacted]w[redacted]d with an Estimated Date of Delivery: 08/25/22 being seen today for ongoing management of a low-risk pregnancy.  Today she reports no complaints. Contractions: Not present.  .  Movement: Present. denies leaking of fluid. Review of Systems:   Pertinent items are noted in HPI Denies abnormal vaginal discharge w/ itching/odor/irritation, headaches, visual changes, shortness of breath, chest pain, abdominal pain, severe nausea/vomiting, or problems with urination or bowel movements unless otherwise stated above. Pertinent History Reviewed:  Reviewed past medical,surgical, social, obstetrical and family history.  Reviewed problem list, medications and allergies. Physical Assessment:   Vitals:   05/11/22 0832  BP: 111/70  Pulse: 66  Weight: 148 lb (67.1 kg)  Body mass index is 24.63 kg/m.        Physical Examination:   General appearance: Well appearing, and in no distress  Mental status: Alert, oriented to person, place, and time  Skin: Warm & dry  Cardiovascular: Normal heart rate noted  Respiratory: Normal respiratory effort, no distress  Abdomen: Soft, gravid, nontender  Pelvic: Cervical exam deferred         Extremities: Edema: None  Fetal Status: Fetal Heart Rate (bpm): 135 Fundal Height: 23 cm Movement: Present    Chaperone:  N/A    No results found for this or any previous visit (from the past 24 hour(s)).  Assessment & Plan:    Pregnancy: G2P1001 at [redacted]w[redacted]d 1. Supervision of other normal pregnancy, antepartum      Meds: No orders of the defined types were placed in this encounter.  Labs/procedures today: none  Plan:  Continue routine obstetrical care  Next visit: prefers will be in person for PN2     Reviewed: Preterm labor  symptoms and general obstetric precautions including but not limited to vaginal bleeding, contractions, leaking of fluid and fetal movement were reviewed in detail with the patient.  All questions were answered. Has home bp cuff.. Check bp weekly, let us know if >140/90.   Follow-up: Return in about 3 weeks (around 06/01/2022) for PN2/LROB.  Future Appointments  Date Time Provider [place]  05/31/2022  8:50 AM [facility]  05/31/2022 10:30 AM Myrtis Ser, CNM [facility]    No orders of the defined types were placed in this encounter.  Christin Fudge DNP, CNM 05/11/2022 8:56 AM

## 2022-05-11 NOTE — Patient Instructions (Signed)

## 2022-05-26 DIAGNOSIS — Z1159 Encounter for screening for other viral diseases: Secondary | ICD-10-CM | POA: Diagnosis not present

## 2022-05-26 DIAGNOSIS — W460XXA Contact with hypodermic needle, initial encounter: Secondary | ICD-10-CM | POA: Diagnosis not present

## 2022-05-31 ENCOUNTER — Encounter: Payer: Self-pay | Admitting: Advanced Practice Midwife

## 2022-05-31 ENCOUNTER — Other Ambulatory Visit: Payer: BC Managed Care – PPO

## 2022-05-31 ENCOUNTER — Ambulatory Visit (INDEPENDENT_AMBULATORY_CARE_PROVIDER_SITE_OTHER): Payer: BC Managed Care – PPO | Admitting: Advanced Practice Midwife

## 2022-05-31 VITALS — BP 111/71 | HR 70 | Wt 149.0 lb

## 2022-05-31 DIAGNOSIS — Z3480 Encounter for supervision of other normal pregnancy, unspecified trimester: Secondary | ICD-10-CM

## 2022-05-31 DIAGNOSIS — Z3A27 27 weeks gestation of pregnancy: Secondary | ICD-10-CM

## 2022-05-31 DIAGNOSIS — Z3483 Encounter for supervision of other normal pregnancy, third trimester: Secondary | ICD-10-CM

## 2022-05-31 DIAGNOSIS — Z131 Encounter for screening for diabetes mellitus: Secondary | ICD-10-CM

## 2022-05-31 NOTE — Patient Instructions (Signed)
Brittney Collins, thank you for choosing our office today! We appreciate the opportunity to meet your healthcare needs. You may receive a short survey by mail, e-mail, or through [employer]. If you are happy with your care we would appreciate if you could take just a few minutes to complete the survey questions. We read all of your comments and take your feedback very seriously. Thank you again for choosing our office.  Center for [employer] Team at [facility]  [facility] at [facility] ([address] [place], [place] [address]) Entrance C, located off of [address] Free 24/7 valet parking   CLASSES: Go to [email or web address] to register for classes (childbirth, breastfeeding, waterbirth, infant CPR, daddy bootcamp, etc.)  Call the office [phone]) or go to [facility] if: You begin to have strong, frequent contractions Your water breaks.  Sometimes it is a big gush of fluid, sometimes it is just a trickle that keeps getting your panties wet or running down your legs You have vaginal bleeding.  It is normal to have a small amount of spotting if your cervix was checked.  You don't feel your baby moving like normal.  If you don't, get you something to eat and drink and lay down and focus on feeling your baby move.   If your baby is still not moving like normal, you should call the office or go to [facility].  Call the office [phone]) or go to [facility] hospital for these signs of pre-eclampsia: Severe headache that does not go away with Tylenol Visual changes- seeing spots, double, blurred vision Pain under your right breast or upper abdomen that does not go away with Tums or heartburn medicine Nausea and/or vomiting Severe swelling in your hands, feet, and face   Tdap Vaccine It is recommended that you get the Tdap vaccine during the third trimester of EACH pregnancy to help protect your baby from getting pertussis (whooping cough) 27-36 weeks is the BEST time to do  this so that you can pass the protection on to your baby. During pregnancy is better than after pregnancy, but if you are unable to get it during pregnancy it will be offered at the hospital.  You can get this vaccine with [name], at the health department, your family doctor, or some local pharmacies Everyone who will be around your baby should also be up-to-date on their vaccines before the baby comes. Adults (who are not pregnant) only need 1 dose of Tdap during adulthood.   [facility] Pediatricians/Family Doctors [facility]): [address] Colette Ribas, [phone]           [facility]: [address], [phone]                [facility]): [address], [phone] (call to ask if accepting patients) [facility] Department: [address], [place], [phone]    [facility] Pediatricians/Family Doctors [facility]): [phone], [phone] [facility]: [address] [place], [phone] [facility]: [address], [phone]  [facility] Doctors  [facility]): [phone] [facility]: [address], [phone]   [facility] Doctors [facility]: [address], [phone]  [facility] Family Doctors  [facility]: [phone], [phone]  Home Blood Pressure Monitoring for Patients   Your provider has recommended that you check your  blood pressure (BP) at least once a week at home. If you do not have a blood pressure cuff at home, one will be provided for you. Contact your provider if you have not received your monitor within 1 week.   Helpful Tips for Accurate Home Blood Pressure Checks  Don't smoke, exercise, or drink caffeine 30 minutes before checking your BP Use the restroom before checking your BP (a full bladder can raise your  pressure) Relax in a comfortable upright chair Feet on the ground Left arm resting comfortably on a flat surface at the level of your heart Legs uncrossed Back supported Sit quietly and don't talk Place the cuff on your bare arm Adjust snuggly, so that only two fingertips can fit between your skin and the top of the cuff Check 2 readings separated by at least one minute Keep a log of your BP readings For a visual, please reference this diagram: http://ccnc.care/bpdiagram  Provider Name: [facility]     Phone: [phone]  Zone 1: ALL CLEAR  Continue to monitor your symptoms:  BP reading is less than 140 (top number) or less than 90 (bottom number)  No right upper stomach pain No headaches or seeing spots No feeling nauseated or throwing up No swelling in face and hands  Zone 2: CAUTION Call your doctor's office for any of the following:  BP reading is greater than 140 (top number) or greater than 90 (bottom number)  Stomach pain under your ribs in the middle or right side Headaches or seeing spots Feeling nauseated or throwing up Swelling in face and hands  Zone 3: EMERGENCY  Seek immediate medical care if you have any of the following:  BP reading is greater than160 (top number) or greater than 110 (bottom number) Severe headaches not improving with Tylenol Serious difficulty catching your breath Any worsening symptoms from Zone 2   Third Trimester of Pregnancy The third trimester is from week 29 through week 42, months 7 through 9. The third trimester is a time when the fetus is growing rapidly. At the end of the ninth month, the fetus is about 20 inches in length and weighs 6-10 pounds.  BODY CHANGES Your body goes through many changes during pregnancy. The changes vary from woman to woman.  Your weight will continue to increase. You can expect to gain 25-35 pounds (11-16 kg) by the end of the pregnancy. You may begin to get stretch marks on your hips, abdomen,  and breasts. You may urinate more often because the fetus is moving lower into your pelvis and pressing on your bladder. You may develop or continue to have heartburn as a result of your pregnancy. You may develop constipation because certain hormones are causing the muscles that push waste through your intestines to slow down. You may develop hemorrhoids or swollen, bulging veins (varicose veins). You may have pelvic pain because of the weight gain and pregnancy hormones relaxing your joints between the bones in your pelvis. Backaches may result from overexertion of the muscles supporting your posture. You may have changes in your hair. These can include thickening of your hair, rapid growth, and changes in texture. Some women also have hair loss during or after pregnancy, or hair that feels dry or thin. Your hair will most likely return to normal after your baby is born. Your breasts will continue to grow and be tender. A yellow discharge may leak from your breasts called colostrum. Your belly button may stick out. You may   feel short of breath because of your expanding uterus. You may notice the fetus "dropping," or moving lower in your abdomen. You may have a bloody mucus discharge. This usually occurs a few days to a week before labor begins. Your cervix becomes thin and soft (effaced) near your due date. WHAT TO EXPECT AT YOUR PRENATAL EXAMS  You will have prenatal exams every 2 weeks until week 36. Then, you will have weekly prenatal exams. During a routine prenatal visit: You will be weighed to make sure you and the fetus are growing normally. Your blood pressure is taken. Your abdomen will be measured to track your baby's growth. The fetal heartbeat will be listened to. Any test results from the previous visit will be discussed. You may have a cervical check near your due date to see if you have effaced. At around 36 weeks, your caregiver will check your cervix. At the same time, your  caregiver will also perform a test on the secretions of the vaginal tissue. This test is to determine if a type of bacteria, Group B streptococcus, is present. Your caregiver will explain this further. Your caregiver may ask you: What your birth plan is. How you are feeling. If you are feeling the baby move. If you have had any abnormal symptoms, such as leaking fluid, bleeding, severe headaches, or abdominal cramping. If you have any questions. Other tests or screenings that may be performed during your third trimester include: Blood tests that check for low iron levels (anemia). Fetal testing to check the health, activity level, and growth of the fetus. Testing is done if you have certain medical conditions or if there are problems during the pregnancy. FALSE LABOR You may feel small, irregular contractions that eventually go away. These are called Braxton Hicks contractions, or false labor. Contractions may last for hours, days, or even weeks before true labor sets in. If contractions come at regular intervals, intensify, or become painful, it is best to be seen by your caregiver.  SIGNS OF LABOR  Menstrual-like cramps. Contractions that are 5 minutes apart or less. Contractions that start on the top of the uterus and spread down to the lower abdomen and back. A sense of increased pelvic pressure or back pain. A watery or bloody mucus discharge that comes from the vagina. If you have any of these signs before the 37th week of pregnancy, call your caregiver right away. You need to go to the hospital to get checked immediately. HOME CARE INSTRUCTIONS  Avoid all smoking, herbs, alcohol, and unprescribed drugs. These chemicals affect the formation and growth of the baby. Follow your caregiver's instructions regarding medicine use. There are medicines that are either safe or unsafe to take during pregnancy. Exercise only as directed by your caregiver. Experiencing uterine cramps is a good sign to  stop exercising. Continue to eat regular, healthy meals. Wear a good support bra for breast tenderness. Do not use hot tubs, steam rooms, or saunas. Wear your seat belt at all times when driving. Avoid raw meat, uncooked cheese, cat litter boxes, and soil used by cats. These carry germs that can cause birth defects in the baby. Take your prenatal vitamins. Try taking a stool softener (if your caregiver approves) if you develop constipation. Eat more high-fiber foods, such as fresh vegetables or fruit and whole grains. Drink plenty of fluids to keep your urine clear or pale yellow. Take warm sitz baths to soothe any pain or discomfort caused by hemorrhoids. Use hemorrhoid cream if   your caregiver approves. If you develop varicose veins, wear support hose. Elevate your feet for 15 minutes, 3-4 times a day. Limit salt in your diet. Avoid heavy lifting, wear low heal shoes, and practice good posture. Rest a lot with your legs elevated if you have leg cramps or low back pain. Visit your dentist if you have not gone during your pregnancy. Use a soft toothbrush to brush your teeth and be gentle when you floss. A sexual relationship may be continued unless your caregiver directs you otherwise. Do not travel far distances unless it is absolutely necessary and only with the approval of your caregiver. Take prenatal classes to understand, practice, and ask questions about the labor and delivery. Make a trial run to the hospital. Pack your hospital bag. Prepare the baby's nursery. Continue to go to all your prenatal visits as directed by your caregiver. SEEK MEDICAL CARE IF: You are unsure if you are in labor or if your water has broken. You have dizziness. You have mild pelvic cramps, pelvic pressure, or nagging pain in your abdominal area. You have persistent nausea, vomiting, or diarrhea. You have a bad smelling vaginal discharge. You have pain with urination. SEEK IMMEDIATE MEDICAL CARE IF:  You  have a fever. You are leaking fluid from your vagina. You have spotting or bleeding from your vagina. You have severe abdominal cramping or pain. You have rapid weight loss or gain. You have shortness of breath with chest pain. You notice sudden or extreme swelling of your face, hands, ankles, feet, or legs. You have not felt your baby move in over an hour. You have severe headaches that do not go away with medicine. You have vision changes. Document Released: 01/24/2001 Document Revised: 02/04/2013 Document Reviewed: 04/02/2012 [facility] Patient Information 2015 [place], [place]. This information is not intended to replace advice given to you by your health care provider. Make sure you discuss any questions you have with your health care provider.

## 2022-05-31 NOTE — Progress Notes (Signed)
   LOW-RISK PREGNANCY VISIT Patient name: Brittney Collins MRN [id number]  Date of birth: Dec 13, 1990 Chief Complaint:   Routine Prenatal Visit (PN2)  History of Present Illness:   Brittney Collins is a 32 y.o. G2P1001 female at [redacted]w[redacted]d with an Estimated Date of Delivery: 08/25/22 being seen today for ongoing management of a low-risk pregnancy.  Today she reports no complaints. Contractions: Not present.  .  Movement: Present. denies leaking of fluid. Review of Systems:   Pertinent items are noted in HPI Denies abnormal vaginal discharge w/ itching/odor/irritation, headaches, visual changes, shortness of breath, chest pain, abdominal pain, severe nausea/vomiting, or problems with urination or bowel movements unless otherwise stated above. Pertinent History Reviewed:  Reviewed past medical,surgical, social, obstetrical and family history.  Reviewed problem list, medications and allergies. Physical Assessment:   Vitals:   05/31/22 0954  BP: 111/71  Pulse: 70  Weight: 149 lb (67.6 kg)  Body mass index is 24.79 kg/m.        Physical Examination:   General appearance: Well appearing, and in no distress  Mental status: Alert, oriented to person, place, and time  Skin: Warm & dry  Cardiovascular: Normal heart rate noted  Respiratory: Normal respiratory effort, no distress  Abdomen: Soft, gravid, nontender  Pelvic: Cervical exam deferred         Extremities: Edema: None  Fetal Status: Fetal Heart Rate (bpm): 152 Fundal Height: 27 cm Movement: Present    No results found for this or any previous visit (from the past 24 hour(s)).  Assessment & Plan:  1) Low-risk pregnancy G2P1001 at [redacted]w[redacted]d with an Estimated Date of Delivery: 08/25/22   2) Hx LGA, will get 36wk EFW   Meds: No orders of the defined types were placed in this encounter.  Labs/procedures today: PN2  Plan:  Continue routine obstetrical care   Reviewed: Preterm labor symptoms and general obstetric precautions  including but not limited to vaginal bleeding, contractions, leaking of fluid and fetal movement were reviewed in detail with the patient.  All questions were answered. Has home bp cuff. Check bp weekly, let us know if >140/90.   Follow-up: Return in about 3 weeks (around 06/21/2022) for LROB, in person.  No orders of the defined types were placed in this encounter.  Arabella Merles CNM 05/31/2022 10:27 AM

## 2022-06-01 LAB — GLUCOSE TOLERANCE, 2 HOURS W/ 1HR
Glucose, 1 hour: 92 mg/dL (ref 70–179)
Glucose, 2 hour: 89 mg/dL (ref 70–152)
Glucose, Fasting: 77 mg/dL (ref 70–91)

## 2022-06-01 LAB — HIV ANTIBODY (ROUTINE TESTING W REFLEX): HIV Screen 4th Generation wRfx: NONREACTIVE

## 2022-06-01 LAB — CBC
Hematocrit: 38.8 % (ref 34.0–46.6)
Hemoglobin: 12.9 g/dL (ref 11.1–15.9)
MCH: 32.6 pg (ref 26.6–33.0)
MCHC: 33.2 g/dL (ref 31.5–35.7)
MCV: 98 fL — ABNORMAL HIGH (ref 79–97)
Platelets: 251 10*3/uL (ref 150–450)
RBC: 3.96 x10E6/uL (ref 3.77–5.28)
RDW: 13 % (ref 11.7–15.4)
WBC: 13.5 10*3/uL — ABNORMAL HIGH (ref 3.4–10.8)

## 2022-06-01 LAB — RPR: RPR Ser Ql: NONREACTIVE

## 2022-06-01 LAB — ANTIBODY SCREEN: Antibody Screen: NEGATIVE

## 2022-06-20 ENCOUNTER — Encounter: Payer: Self-pay | Admitting: Women's Health

## 2022-06-20 ENCOUNTER — Ambulatory Visit (INDEPENDENT_AMBULATORY_CARE_PROVIDER_SITE_OTHER): Payer: BC Managed Care – PPO | Admitting: Women's Health

## 2022-06-20 VITALS — BP 116/64 | HR 65 | Wt 155.0 lb

## 2022-06-20 DIAGNOSIS — Z3483 Encounter for supervision of other normal pregnancy, third trimester: Secondary | ICD-10-CM

## 2022-06-20 DIAGNOSIS — Z3480 Encounter for supervision of other normal pregnancy, unspecified trimester: Secondary | ICD-10-CM

## 2022-06-20 DIAGNOSIS — Z3A30 30 weeks gestation of pregnancy: Secondary | ICD-10-CM

## 2022-06-20 NOTE — Progress Notes (Signed)
LOW-RISK PREGNANCY VISIT Patient name: Brittney Collins MRN [id number]  Date of birth: 24-May-1990 Chief Complaint:   Routine Prenatal Visit  History of Present Illness:   Brittney Collins is a 32 y.o. G2P1001 female at [redacted]w[redacted]d with an Estimated Date of Delivery: 08/25/22 being seen today for ongoing management of a low-risk pregnancy.   Today she reports no complaints. Still planning waterbirth, had class last pregnancy. Signed up for class this pregnancy but was unable to download app in time, has powerpoint slides they sent via email and feels comfortable w/ this info in combination w/ previous class. Contractions: Not present.  .  Movement: Present. denies leaking of fluid.     02/20/2022    9:56 AM  Depression screen PHQ 2/9  Decreased Interest 0  Down, Depressed, Hopeless 0  PHQ - 2 Score 0  Altered sleeping 0  Tired, decreased energy 0  Change in appetite 0  Feeling bad or failure about yourself  0  Trouble concentrating 0  Moving slowly or fidgety/restless 0  Suicidal thoughts 0  PHQ-9 Score 0        02/20/2022    9:56 AM  GAD 7 : Generalized Anxiety Score  Nervous, Anxious, on Edge 0  Control/stop worrying 0  Worry too much - different things 0  Trouble relaxing 0  Restless 0  Easily annoyed or irritable 0  Afraid - awful might happen 0  Total GAD 7 Score 0      Review of Systems:   Pertinent items are noted in HPI Denies abnormal vaginal discharge w/ itching/odor/irritation, headaches, visual changes, shortness of breath, chest pain, abdominal pain, severe nausea/vomiting, or problems with urination or bowel movements unless otherwise stated above. Pertinent History Reviewed:  Reviewed past medical,surgical, social, obstetrical and family history.  Reviewed problem list, medications and allergies. Physical Assessment:   Vitals:   06/20/22 1604  BP: 116/64  Pulse: 65  Weight: 155 lb (70.3 kg)  Body mass index is 25.79 kg/m.        Physical  Examination:   General appearance: Well appearing, and in no distress  Mental status: Alert, oriented to person, place, and time  Skin: Warm & dry  Cardiovascular: Normal heart rate noted  Respiratory: Normal respiratory effort, no distress  Abdomen: Soft, gravid, nontender  Pelvic: Cervical exam deferred         Extremities: Edema: None  Fetal Status: Fetal Heart Rate (bpm): 144 Fundal Height: 30 cm Movement: Present    Chaperone: N/A   No results found for this or any previous visit (from the past 24 hour(s)).  Assessment & Plan:  1) Low-risk pregnancy G2P1001 at [redacted]w[redacted]d with an Estimated Date of Delivery: 08/25/22   2) Plans waterbirth, has had class prev preg, understands providers at hospital on admission will let her know if able to have waterbirth   3) H/O 3rd degree lac w/ 9lb2oz> plan EFW ~36-38w   Meds: No orders of the defined types were placed in this encounter.  Labs/procedures today: none  Plan:  Continue routine obstetrical care  Next visit: prefers in person    Reviewed: Preterm labor symptoms and general obstetric precautions including but not limited to vaginal bleeding, contractions, leaking of fluid and fetal movement were reviewed in detail with the patient.  All questions were answered. Does have home bp cuff. Office bp cuff given: not applicable. Check bp weekly, let us know if consistently >140 and/or >90.  Follow-up: Return in about 2 weeks (  around 07/04/2022) for LROB, CNM, in person.  Future Appointments  Date Time Provider Department Center  07/04/2022  3:10 PM Cheral Marker, CNM [facility]    No orders of the defined types were placed in this encounter.  Cheral Marker CNM, [facility] 06/20/2022 4:36 PM

## 2022-06-20 NOTE — Patient Instructions (Addendum)
Brittney Collins, thank you for choosing our office today! We appreciate the opportunity to meet your healthcare needs. You may receive a short survey by mail, e-mail, or through [employer]. If you are happy with your care we would appreciate if you could take just a few minutes to complete the survey questions. We read all of your comments and take your feedback very seriously. Thank you again for choosing our office.  [facility] [employer] Team at [facility]  [facility] at [facility] ([address] [place], [place] [address]) Entrance C, located off of [address] Free 24/7 valet parking   CLASSES: Go to [email or web address] to register for classes (childbirth, breastfeeding, waterbirth, infant CPR, daddy bootcamp, etc.)  Call the office [phone]) or go to [facility] if: You begin to have strong, frequent contractions Your water breaks.  Sometimes it is a big gush of fluid, sometimes it is just a trickle that keeps getting your panties wet or running down your legs You have vaginal bleeding.  It is normal to have a small amount of spotting if your cervix was checked.  You don't feel your baby moving like normal.  If you don't, get you something to eat and drink and lay down and focus on feeling your baby move.   If your baby is still not moving like normal, you should call the office or go to [facility].  Call the office [phone]) or go to [facility] hospital for these signs of pre-eclampsia: Severe headache that does not go away with Tylenol Visual changes- seeing spots, double, blurred vision Pain under your right breast or upper abdomen that does not go away with Tums or heartburn medicine Nausea and/or vomiting Severe swelling in your hands, feet, and face   Tdap Vaccine It is recommended that you get the Tdap vaccine during the third trimester of EACH pregnancy to help protect your baby from getting pertussis (whooping cough) 27-36 weeks is the BEST time to do  this so that you can pass the protection on to your baby. During pregnancy is better than after pregnancy, but if you are unable to get it during pregnancy it will be offered at the hospital.  You can get this vaccine with [name], at the health department, your family doctor, or some local pharmacies Everyone who will be around your baby should also be up-to-date on their vaccines before the baby comes. Adults (who are not pregnant) only need 1 dose of Tdap during adulthood.   [facility] Pediatricians/Family Doctors [facility]): [address] Colette Ribas, [phone]           [facility]: [address], [phone]                [facility]): [address], [phone] (call to ask if accepting patients) [facility] Department: [address], [place], [phone]    [facility] Pediatricians/Family Doctors [facility]): [phone], [phone] [facility]: [address] [place], [phone] [facility]: [address], [phone]  [facility] Doctors  [facility]): [phone] [facility]: [address], [phone]   [facility] Doctors [facility]: [address], [phone]  [facility] Family Doctors  [facility]: [phone], [phone]  Home Blood Pressure Monitoring for Patients   Your provider has recommended that you check your  blood pressure (BP) at least once a week at home. If you do not have a blood pressure cuff at home, one will be provided for you. Contact your provider if you have not received your monitor within 1 week.   Helpful Tips for Accurate Home Blood Pressure Checks  Don't smoke, exercise, or drink caffeine 30 minutes before checking your BP Use the restroom before checking your BP (a full bladder can raise your  pressure) Relax in a comfortable upright chair Feet on the ground Left arm resting comfortably on a flat surface at the level of your heart Legs uncrossed Back supported Sit quietly and don't talk Place the cuff on your bare arm Adjust snuggly, so that only two fingertips can fit between your skin and the top of the cuff Check 2 readings separated by at least one minute Keep a log of your BP readings For a visual, please reference this diagram: http://ccnc.care/bpdiagram  Provider Name: [facility]     Phone: [phone]  Zone 1: ALL CLEAR  Continue to monitor your symptoms:  BP reading is less than 140 (top number) or less than 90 (bottom number)  No right upper stomach pain No headaches or seeing spots No feeling nauseated or throwing up No swelling in face and hands  Zone 2: CAUTION Call your doctor's office for any of the following:  BP reading is greater than 140 (top number) or greater than 90 (bottom number)  Stomach pain under your ribs in the middle or right side Headaches or seeing spots Feeling nauseated or throwing up Swelling in face and hands  Zone 3: EMERGENCY  Seek immediate medical care if you have any of the following:  BP reading is greater than160 (top number) or greater than 110 (bottom number) Severe headaches not improving with Tylenol Serious difficulty catching your breath Any worsening symptoms from Zone 2   Third Trimester of Pregnancy The third trimester is from week 29 through week 42, months 7 through 9. The third trimester is a time when the fetus is growing rapidly. At the end of the ninth month, the fetus is about 20 inches in length and weighs 6-10 pounds.  BODY CHANGES Your body goes through many changes during pregnancy. The changes vary from woman to woman.  Your weight will continue to increase. You can expect to gain 25-35 pounds (11-16 kg) by the end of the pregnancy. You may begin to get stretch marks on your hips, abdomen,  and breasts. You may urinate more often because the fetus is moving lower into your pelvis and pressing on your bladder. You may develop or continue to have heartburn as a result of your pregnancy. You may develop constipation because certain hormones are causing the muscles that push waste through your intestines to slow down. You may develop hemorrhoids or swollen, bulging veins (varicose veins). You may have pelvic pain because of the weight gain and pregnancy hormones relaxing your joints between the bones in your pelvis. Backaches may result from overexertion of the muscles supporting your posture. You may have changes in your hair. These can include thickening of your hair, rapid growth, and changes in texture. Some women also have hair loss during or after pregnancy, or hair that feels dry or thin. Your hair will most likely return to normal after your baby is born. Your breasts will continue to grow and be tender. A yellow discharge may leak from your breasts called colostrum. Your belly button may stick out. You may   feel short of breath because of your expanding uterus. You may notice the fetus "dropping," or moving lower in your abdomen. You may have a bloody mucus discharge. This usually occurs a few days to a week before labor begins. Your cervix becomes thin and soft (effaced) near your due date. WHAT TO EXPECT AT YOUR PRENATAL EXAMS  You will have prenatal exams every 2 weeks until week 36. Then, you will have weekly prenatal exams. During a routine prenatal visit: You will be weighed to make sure you and the fetus are growing normally. Your blood pressure is taken. Your abdomen will be measured to track your baby's growth. The fetal heartbeat will be listened to. Any test results from the previous visit will be discussed. You may have a cervical check near your due date to see if you have effaced. At around 36 weeks, your caregiver will check your cervix. At the same time, your  caregiver will also perform a test on the secretions of the vaginal tissue. This test is to determine if a type of bacteria, Group B streptococcus, is present. Your caregiver will explain this further. Your caregiver may ask you: What your birth plan is. How you are feeling. If you are feeling the baby move. If you have had any abnormal symptoms, such as leaking fluid, bleeding, severe headaches, or abdominal cramping. If you have any questions. Other tests or screenings that may be performed during your third trimester include: Blood tests that check for low iron levels (anemia). Fetal testing to check the health, activity level, and growth of the fetus. Testing is done if you have certain medical conditions or if there are problems during the pregnancy. FALSE LABOR You may feel small, irregular contractions that eventually go away. These are called Braxton Hicks contractions, or false labor. Contractions may last for hours, days, or even weeks before true labor sets in. If contractions come at regular intervals, intensify, or become painful, it is best to be seen by your caregiver.  SIGNS OF LABOR  Menstrual-like cramps. Contractions that are 5 minutes apart or less. Contractions that start on the top of the uterus and spread down to the lower abdomen and back. A sense of increased pelvic pressure or back pain. A watery or bloody mucus discharge that comes from the vagina. If you have any of these signs before the 37th week of pregnancy, call your caregiver right away. You need to go to the hospital to get checked immediately. HOME CARE INSTRUCTIONS  Avoid all smoking, herbs, alcohol, and unprescribed drugs. These chemicals affect the formation and growth of the baby. Follow your caregiver's instructions regarding medicine use. There are medicines that are either safe or unsafe to take during pregnancy. Exercise only as directed by your caregiver. Experiencing uterine cramps is a good sign to  stop exercising. Continue to eat regular, healthy meals. Wear a good support bra for breast tenderness. Do not use hot tubs, steam rooms, or saunas. Wear your seat belt at all times when driving. Avoid raw meat, uncooked cheese, cat litter boxes, and soil used by cats. These carry germs that can cause birth defects in the baby. Take your prenatal vitamins. Try taking a stool softener (if your caregiver approves) if you develop constipation. Eat more high-fiber foods, such as fresh vegetables or fruit and whole grains. Drink plenty of fluids to keep your urine clear or pale yellow. Take warm sitz baths to soothe any pain or discomfort caused by hemorrhoids. Use hemorrhoid cream if   your caregiver approves. If you develop varicose veins, wear support hose. Elevate your feet for 15 minutes, 3-4 times a day. Limit salt in your diet. Avoid heavy lifting, wear low heal shoes, and practice good posture. Rest a lot with your legs elevated if you have leg cramps or low back pain. Visit your dentist if you have not gone during your pregnancy. Use a soft toothbrush to brush your teeth and be gentle when you floss. A sexual relationship may be continued unless your caregiver directs you otherwise. Do not travel far distances unless it is absolutely necessary and only with the approval of your caregiver. Take prenatal classes to understand, practice, and ask questions about the labor and delivery. Make a trial run to the hospital. Pack your hospital bag. Prepare the baby's nursery. Continue to go to all your prenatal visits as directed by your caregiver. SEEK MEDICAL CARE IF: You are unsure if you are in labor or if your water has broken. You have dizziness. You have mild pelvic cramps, pelvic pressure, or nagging pain in your abdominal area. You have persistent nausea, vomiting, or diarrhea. You have a bad smelling vaginal discharge. You have pain with urination. SEEK IMMEDIATE MEDICAL CARE IF:  You  have a fever. You are leaking fluid from your vagina. You have spotting or bleeding from your vagina. You have severe abdominal cramping or pain. You have rapid weight loss or gain. You have shortness of breath with chest pain. You notice sudden or extreme swelling of your face, hands, ankles, feet, or legs. You have not felt your baby move in over an hour. You have severe headaches that do not go away with medicine. You have vision changes. Document Released: 01/24/2001 Document Revised: 02/04/2013 Document Reviewed: 04/02/2012 [facility] Patient Information 2015 [place], [place]. This information is not intended to replace advice given to you by your health care provider. Make sure you discuss any questions you have with your health care provider.   Considering Waterbirth? Guide for patients at [facility] [employer] [facility]) Why consider waterbirth? Gentle birth for babies  Less pain medicine used in labor  May allow for passive descent/less pushing  May reduce perineal tears  More mobility and instinctive maternal position changes  Increased maternal relaxation   Is waterbirth safe? What are the risks of infection, drowning or other complications? Infection:  Very low risk (3.7 % for tub vs 4.8% for bed)  7 in 8000 waterbirths with documented infection  Poorly cleaned equipment most common cause  Slightly lower group B strep transmission rate  Drowning  Maternal:  Very low risk  Related to seizures or fainting  Newborn:  Very low risk. No evidence of increased risk of respiratory problems in multiple large studies  Physiological protection from breathing under water  Avoid underwater birth if there are any fetal complications  Once baby's head is out of the water, keep it out.  Birth complication  Some reports of cord trauma, but risk decreased by bringing baby to surface gradually  No evidence of increased risk of shoulder dystocia. Mothers can usually change positions  faster in water than in a bed, possibly aiding the maneuvers to free the shoulder.   There are 2 things you MUST do to have a waterbirth with [facility]: Attend a waterbirth class at [employer] [facility] at [facility]   3rd Wednesday of every month from 7-9 pm (virtual during COVID) [employer] at [email or web address] or [email or web address] or by calling [phone] Bring [name]  the certificate from the class to your prenatal appointment or send via MyChart Meet with a midwife at 36 weeks* to see if you can still plan a waterbirth and to sign the consent.   *We also recommend that you schedule as many of your prenatal visits with a midwife as possible.    Helpful information: You may want to bring a bathing suit top to the hospital to wear during labor but this is optional.  All other supplies are provided by the hospital. Please arrive at the hospital with signs of active labor, and do not wait at home until late in labor. It takes 45 min- 1 hour for fetal monitoring, and check in to your room to take place, plus transport and filling of the waterbirth tub.    Things that would prevent you from having a waterbirth: Premature, <37wks  Previous cesarean birth  Presence of thick meconium-stained fluid  Multiple gestation (Twins, triplets, etc.)  Uncontrolled diabetes or gestational diabetes requiring medication  Hypertension diagnosed in pregnancy or preexisting hypertension (gestational hypertension, preeclampsia, or chronic hypertension) Fetal growth restriction (your baby measures less than 10th percentile on ultrasound) Heavy vaginal bleeding  Non-reassuring fetal heart rate  Active infection (MRSA, etc.). Group B Strep is NOT a contraindication for waterbirth.  If your labor has to be induced and induction method requires continuous monitoring of the baby's heart rate  Other risks/issues identified by your obstetrical provider   Please remember that birth is  unpredictable. Under certain unforeseeable circumstances your provider may advise against giving birth in the tub. These decisions will be made on a case-by-case basis and with the safety of you and your baby as our highest priority.    Updated 05/18/21   Breastfeeding and Breast Care Breastfeeding can be challenging, especially during the first few weeks after childbirth. It is normal to have some problems when you start to breastfeed your new baby, even if you have breastfed before. There are things that you can do to take care of yourself and help prevent common breastfeeding problems. Work with your health care provider or breastfeeding specialist (Advertising copywriter) to find strategies that work best for you. How does self-care during breastfeeding benefit me? Keeping your breasts healthy and ensuring that your baby attaches to your nipple well (good latch) are important components of a good breastfeeding experience. A good latch ensures that you will avoid common problems, such as: Cracked or sore nipples. Breasts becoming overfilled with milk (engorgement). Plugged milk ducts. Low milk supply. Breast inflammation or infection. How does self-care benefit my baby? By taking steps to avoid breastfeeding problems, you help ensure that your baby can feed effectively and gain weight as he or she should. What actions can I take to care for myself during breastfeeding? Breastfeeding strategy Always make sure that your baby latches and is in a proper position. Try different breastfeeding positions to find one that works best for you and your baby. Breastfeed when you feel the need to reduce the fullness of your breasts or when your baby shows signs of hunger. This is called "breastfeeding on demand." Do not delay feedings. Try to relax when it is time to feed your baby. This helps to trigger your let-down reflex, which releases milk from your breast. To help increase milk flow: Pump or hand  express a small amount of breast milk right before breastfeeding to soften your breast, areola, and nipple. Apply warm, moist heat to your breast right before feeding to increase circulation  and help milk flow. You can do this in the shower or with hand towels soaked with warm water. Massage your breast right before or during feeding to increase circulation and help milk flow. Breast care     Ensure that your breasts stay moisturized and healthy. This will help prevent cracking and ease soreness. To do this: Avoid using soap on your nipples. Let your nipples air-dry for 3-4 minutes after each feeding. Do not use drying aids like a hair dryer to dry breasts. This can cause the skin to further become dry, leading to more irritation. Use only cotton bra pads to absorb breast milk that leaks. Be sure to change the pads if they become soaked with milk. If you use disposable bra pads, change them often. Use lanolin on your nipples after nursing. If you use pure lanolin, you do not need to wash it off before feeding your baby again. Pure lanolin is not poisonous to your baby. Massage some breast milk into your nipples. Use your hand to squeeze out a few drops of breast milk (hand express). Gently massage the milk into your nipples. Let your nipples air-dry. Wear a supportive nursing bra. Avoid wearing tight clothing, bras that put pressure on your breasts, or underwire bras. Use cold therapy to help relieve pain or swelling of your breasts. To do this: Put ice in a plastic bag. Place a towel between your skin and the bag. Leave the ice on for 20 minutes, 2-3 times a day. Follow these instructions at home: Drink enough fluid to keep your urine pale yellow. Get plenty of rest. Sleep when your baby sleeps. Talk to your health care provider or lactation consultant before taking any herbal supplements. Eat foods that have good nutrients. Eat a balanced diet of fruits, vegetables, whole grains, lean  proteins, and dairy or dairy alternatives. Contact a health care provider if: You have nipple pain. You have cracking or soreness in your nipples that lasts longer than 1 week. You have breast engorgement that lasts longer than 48 hours. You have a fever. You have pus-like discharge coming from your nipple. You have redness, a rash, swelling, itching, or burning on your breast. Your baby does not gain weight or loses weight. Your baby is not feeding regularly or is very sleepy or lethargic. Summary Keeping your breasts healthy and ensuring a good latch are important components of a good breastfeeding experience. There are things that you can do to take care of yourself and help prevent common breastfeeding problems. Work with your health care provider or breastfeeding specialist (Advertising copywriter) to find strategies that work best for you. Always make sure that your baby is latched and positioned properly. Try different breastfeeding positions to find one that works best for you and your baby. Keep your nipples moisturized, drink plenty of fluid, and get plenty of rest. Feed on demand, and do not delay feedings. This information is not intended to replace advice given to you by your health care provider. Make sure you discuss any questions you have with your health care provider. Document Revised: 05/20/2021 Document Reviewed: 07/22/2019 [employer]vier Patient Education  2023 [employer].

## 2022-06-27 DIAGNOSIS — Z23 Encounter for immunization: Secondary | ICD-10-CM | POA: Diagnosis not present

## 2022-07-04 ENCOUNTER — Encounter: Payer: Self-pay | Admitting: Women's Health

## 2022-07-04 ENCOUNTER — Ambulatory Visit (INDEPENDENT_AMBULATORY_CARE_PROVIDER_SITE_OTHER): Payer: BC Managed Care – PPO | Admitting: Women's Health

## 2022-07-04 VITALS — BP 110/64 | HR 71 | Wt 156.4 lb

## 2022-07-04 DIAGNOSIS — Z3480 Encounter for supervision of other normal pregnancy, unspecified trimester: Secondary | ICD-10-CM

## 2022-07-04 DIAGNOSIS — Z3A32 32 weeks gestation of pregnancy: Secondary | ICD-10-CM

## 2022-07-04 DIAGNOSIS — Z3483 Encounter for supervision of other normal pregnancy, third trimester: Secondary | ICD-10-CM

## 2022-07-04 NOTE — Progress Notes (Signed)
LOW-RISK PREGNANCY VISIT Patient name: Brittney Collins MRN [id number]  Date of birth: 02/09/1991 Chief Complaint:   Routine Prenatal Visit  History of Present Illness:   Brittney Collins is a 32 y.o. G2P1001 female at [redacted]w[redacted]d with an Estimated Date of Delivery: 08/25/22 being seen today for ongoing management of a low-risk pregnancy.   Today she reports no complaints. Contractions: Irregular.  .  Movement: Present. denies leaking of fluid.     02/20/2022    9:56 AM  Depression screen PHQ 2/9  Decreased Interest 0  Down, Depressed, Hopeless 0  PHQ - 2 Score 0  Altered sleeping 0  Tired, decreased energy 0  Change in appetite 0  Feeling bad or failure about yourself  0  Trouble concentrating 0  Moving slowly or fidgety/restless 0  Suicidal thoughts 0  PHQ-9 Score 0        02/20/2022    9:56 AM  GAD 7 : Generalized Anxiety Score  Nervous, Anxious, on Edge 0  Control/stop worrying 0  Worry too much - different things 0  Trouble relaxing 0  Restless 0  Easily annoyed or irritable 0  Afraid - awful might happen 0  Total GAD 7 Score 0      Review of Systems:   Pertinent items are noted in HPI Denies abnormal vaginal discharge w/ itching/odor/irritation, headaches, visual changes, shortness of breath, chest pain, abdominal pain, severe nausea/vomiting, or problems with urination or bowel movements unless otherwise stated above. Pertinent History Reviewed:  Reviewed past medical,surgical, social, obstetrical and family history.  Reviewed problem list, medications and allergies. Physical Assessment:   Vitals:   07/04/22 1520  BP: 110/64  Pulse: 71  Weight: 156 lb 6.4 oz (70.9 kg)  Body mass index is 26.03 kg/m.        Physical Examination:   General appearance: Well appearing, and in no distress  Mental status: Alert, oriented to person, place, and time  Skin: Warm & dry  Cardiovascular: Normal heart rate noted  Respiratory: Normal respiratory effort, no  distress  Abdomen: Soft, gravid, nontender  Pelvic: Cervical exam deferred         Extremities: Edema: None  Fetal Status: Fetal Heart Rate (bpm): 134 Fundal Height: 31 cm Movement: Present    Chaperone: N/A   No results found for this or any previous visit (from the past 24 hour(s)).  Assessment & Plan:  1) Low-risk pregnancy G2P1001 at [redacted]w[redacted]d with an Estimated Date of Delivery: 08/25/22   2) Interested in Systems developer, took class last pregnancy, attempted to take class this pregnancy but had hard time getting on, has powerpoints from presentation and is comfortable w/ this as a refresher. Understands providers at hospital on admission will let her know if able to have waterbirth   3) H/O 3rd degree lac w/ 9lb2oz> plan EFW ~36wks   Meds: No orders of the defined types were placed in this encounter.  Labs/procedures today: none  Plan:  Continue routine obstetrical care  Next visit: prefers in person    Reviewed: Preterm labor symptoms and general obstetric precautions including but not limited to vaginal bleeding, contractions, leaking of fluid and fetal movement were reviewed in detail with the patient.  All questions were answered. Does have home bp cuff. Office bp cuff given: not applicable. Check bp weekly, let us know if consistently >140 and/or >90.  Follow-up: Return in about 2 weeks (around 07/18/2022) for LROB, CNM, in person; then 4wks from now EFW u/s and  LROB w CNM.  Future Appointments  Date Time Provider Department Center  07/18/2022  9:50 AM Cheral Marker, CNM [facility]    No orders of the defined types were placed in this encounter.  Cheral Marker CNM, [facility] 07/04/2022 3:37 PM

## 2022-07-04 NOTE — Patient Instructions (Signed)
Mardella Layman, thank you for choosing our office today! We appreciate the opportunity to meet your healthcare needs. You may receive a short survey by mail, e-mail, or through [employer]. If you are happy with your care we would appreciate if you could take just a few minutes to complete the survey questions. We read all of your comments and take your feedback very seriously. Thank you again for choosing our office.  Center for [employer] Team at [facility]  [facility] at [facility] ([address] [place], [place] [address]) Entrance C, located off of [address] Free 24/7 valet parking   CLASSES: Go to [email or web address] to register for classes (childbirth, breastfeeding, waterbirth, infant CPR, daddy bootcamp, etc.)  Call the office [phone]) or go to [facility] if: You begin to have strong, frequent contractions Your water breaks.  Sometimes it is a big gush of fluid, sometimes it is just a trickle that keeps getting your panties wet or running down your legs You have vaginal bleeding.  It is normal to have a small amount of spotting if your cervix was checked.  You don't feel your baby moving like normal.  If you don't, get you something to eat and drink and lay down and focus on feeling your baby move.   If your baby is still not moving like normal, you should call the office or go to [facility].  Call the office [phone]) or go to [facility] hospital for these signs of pre-eclampsia: Severe headache that does not go away with Tylenol Visual changes- seeing spots, double, blurred vision Pain under your right breast or upper abdomen that does not go away with Tums or heartburn medicine Nausea and/or vomiting Severe swelling in your hands, feet, and face   Tdap Vaccine It is recommended that you get the Tdap vaccine during the third trimester of EACH pregnancy to help protect your baby from getting pertussis (whooping cough) 27-36 weeks is the BEST time to do  this so that you can pass the protection on to your baby. During pregnancy is better than after pregnancy, but if you are unable to get it during pregnancy it will be offered at the hospital.  You can get this vaccine with [name], at the health department, your family doctor, or some local pharmacies Everyone who will be around your baby should also be up-to-date on their vaccines before the baby comes. Adults (who are not pregnant) only need 1 dose of Tdap during adulthood.   [facility] Pediatricians/Family Doctors [facility]): [address] Colette Ribas, [phone]           [facility]: [address], [phone]                [facility]): [address], [phone] (call to ask if accepting patients) [facility] Department: [address], [place], [phone]    [facility] Pediatricians/Family Doctors [facility]): [phone] [address], [phone] [facility]: [address] [place], [phone] [facility]: [address], [phone]  [facility] Doctors  [facility]): [phone] [facility]: [address] [phone]   [facility] Doctors [facility]: [address] [phone]  [facility] Family Doctors  [facility]: [phone], [phone]  Home Blood Pressure Monitoring for Patients   Your provider has recommended that you check your  blood pressure (BP) at least once a week at home. If you do not have a blood pressure cuff at home, one will be provided for you. Contact your provider if you have not received your monitor within 1 week.   Helpful Tips for Accurate Home Blood Pressure Checks  Don't smoke, exercise, or drink caffeine 30 minutes before checking your BP Use the restroom before checking your BP (a full bladder can raise your  pressure) Relax in a comfortable upright chair Feet on the ground Left arm resting comfortably on a flat surface at the level of your heart Legs uncrossed Back supported Sit quietly and don't talk Place the cuff on your bare arm Adjust snuggly, so that only two fingertips can fit between your skin and the top of the cuff Check 2 readings separated by at least one minute Keep a log of your BP readings For a visual, please reference this diagram: http://ccnc.care/bpdiagram  Provider Name: [facility]     Phone: [phone]  Zone 1: ALL CLEAR  Continue to monitor your symptoms:  BP reading is less than 140 (top number) or less than 90 (bottom number)  No right upper stomach pain No headaches or seeing spots No feeling nauseated or throwing up No swelling in face and hands  Zone 2: CAUTION Call your doctor's office for any of the following:  BP reading is greater than 140 (top number) or greater than 90 (bottom number)  Stomach pain under your ribs in the middle or right side Headaches or seeing spots Feeling nauseated or throwing up Swelling in face and hands  Zone 3: EMERGENCY  Seek immediate medical care if you have any of the following:  BP reading is greater than160 (top number) or greater than 110 (bottom number) Severe headaches not improving with Tylenol Serious difficulty catching your breath Any worsening symptoms from Zone 2  Preterm Labor and Birth Information  The normal length of a pregnancy is 39-41 weeks. Preterm labor is when labor starts before 37 completed weeks of pregnancy. What are the risk factors for preterm labor? Preterm labor is more likely to occur in women who: Have certain infections during pregnancy such as a bladder infection, sexually transmitted infection, or infection inside the uterus (chorioamnionitis). Have a shorter-than-normal cervix. Have gone into preterm labor before. Have had surgery on their cervix. Are younger than age 31  or older than age 10. Are African American. Are pregnant with twins or multiple babies (multiple gestation). Take street drugs or smoke while pregnant. Do not gain enough weight while pregnant. Became pregnant shortly after having been pregnant. What are the symptoms of preterm labor? Symptoms of preterm labor include: Cramps similar to those that can happen during a menstrual period. The cramps may happen with diarrhea. Pain in the abdomen or lower back. Regular uterine contractions that may feel like tightening of the abdomen. A feeling of increased pressure in the pelvis. Increased watery or bloody mucus discharge from the vagina. Water breaking (ruptured amniotic sac). Why is it important to recognize signs of preterm labor? It is important to recognize signs of preterm labor because babies who are born prematurely may not be fully developed. This can put them at an increased risk for: Long-term (chronic) heart and lung problems. Difficulty immediately after birth with regulating body systems, including blood sugar, body temperature, heart rate, and breathing rate. Bleeding in the brain. Cerebral palsy. Learning difficulties. Death. These risks are highest for babies who are born before 34 weeks  of pregnancy. How is preterm labor treated? Treatment depends on the length of your pregnancy, your condition, and the health of your baby. It may involve: Having a stitch (suture) placed in your cervix to prevent your cervix from opening too early (cerclage). Taking or being given medicines, such as: Hormone medicines. These may be given early in pregnancy to help support the pregnancy. Medicine to stop contractions. Medicines to help mature the baby's lungs. These may be prescribed if the risk of delivery is high. Medicines to prevent your baby from developing cerebral palsy. If the labor happens before 34 weeks of pregnancy, you may need to stay in the hospital. What should I do if I  think I am in preterm labor? If you think that you are going into preterm labor, call your health care provider right away. How can I prevent preterm labor in future pregnancies? To increase your chance of having a full-term pregnancy: Do not use any tobacco products, such as cigarettes, chewing tobacco, and e-cigarettes. If you need help quitting, ask your health care provider. Do not use street drugs or medicines that have not been prescribed to you during your pregnancy. Talk with your health care provider before taking any herbal supplements, even if you have been taking them regularly. Make sure you gain a healthy amount of weight during your pregnancy. Watch for infection. If you think that you might have an infection, get it checked right away. Make sure to tell your health care provider if you have gone into preterm labor before. This information is not intended to replace advice given to you by your health care provider. Make sure you discuss any questions you have with your health care provider. Document Revised: 05/24/2018 Document Reviewed: 06/23/2015 Elsevier Patient Education  2020 [employer].

## 2022-07-18 ENCOUNTER — Ambulatory Visit (INDEPENDENT_AMBULATORY_CARE_PROVIDER_SITE_OTHER): Payer: BC Managed Care – PPO | Admitting: Obstetrics and Gynecology

## 2022-07-18 ENCOUNTER — Encounter: Payer: Self-pay | Admitting: Obstetrics and Gynecology

## 2022-07-18 VITALS — BP 118/74 | HR 58 | Wt 158.0 lb

## 2022-07-18 DIAGNOSIS — O09293 Supervision of pregnancy with other poor reproductive or obstetric history, third trimester: Secondary | ICD-10-CM

## 2022-07-18 DIAGNOSIS — Z3A34 34 weeks gestation of pregnancy: Secondary | ICD-10-CM

## 2022-07-18 DIAGNOSIS — O09299 Supervision of pregnancy with other poor reproductive or obstetric history, unspecified trimester: Secondary | ICD-10-CM

## 2022-07-18 DIAGNOSIS — Z3403 Encounter for supervision of normal first pregnancy, third trimester: Secondary | ICD-10-CM

## 2022-07-18 NOTE — Progress Notes (Signed)
   PRENATAL VISIT NOTE  Subjective:  Brittney Collins is a 32 y.o. G2P1001 at [redacted]w[redacted]d being seen today for ongoing prenatal care.  She is currently monitored for the following issues for this low-risk pregnancy and has Right ovarian cyst; Encounter for supervision of normal pregnancy, antepartum; and Placenta previa on their problem list.  Patient reports no complaints.  Contractions: Not present. Vag. Bleeding: None.  Movement: Present. Denies leaking of fluid.   The following portions of the patient's history were reviewed and updated as appropriate: allergies, current medications, past family history, past medical history, past social history, past surgical history and problem list.   Objective:   Vitals:   07/18/22 0953  BP: 118/74  Pulse: (!) 58  Weight: 158 lb (71.7 kg)    Fetal Status: Fetal Heart Rate (bpm): 146 Fundal Height: 35 cm Movement: Present     General:  Alert, oriented and cooperative. Patient is in no acute distress.  Skin: Skin is warm and dry. No rash noted.   Cardiovascular: Normal heart rate noted  Respiratory: Normal respiratory effort, no problems with respiration noted  Abdomen: Soft, gravid, appropriate for gestational age.  Pain/Pressure: Absent     Pelvic: Cervical exam deferred        Extremities: Normal range of motion.  Edema: None  Mental Status: Normal mood and affect. Normal behavior. Normal judgment and thought content.   Assessment and Plan:  Pregnancy: G2P1001 at [redacted]w[redacted]d 1. Encounter for supervision of low-risk first pregnancy in third trimester BP and FHR normal FH appropriate Feeling well overall, vigorous fetal movement Interested in waterbirth, completed class last preg, notified of the 6/6 waterbirth class- pt advisted providers at hospital will make final determination Anticipatory guidance on 36 week appt with swabs  2. History of maternal third degree perineal laceration, currently pregnant Hx 9lb 2oz with 3 degree lac last  delivery  Growth scan scheduled for 6/17   Preterm labor symptoms and general obstetric precautions including but not limited to vaginal bleeding, contractions, leaking of fluid and fetal movement were reviewed in detail with the patient. Please refer to After Visit Summary for other counseling recommendations.   Future Appointments  Date Time Provider Department Center  07/31/2022  3:45 PM [facility] [name] [facility] None  07/31/2022  4:30 PM Hermina Staggers, MD [facility]  08/07/2022 10:30 AM Jacklyn Shell, CNM [facility]  08/14/2022  4:10 PM Cheral Marker, CNM [facility]  08/21/2022  4:10 PM Cheral Marker, CNM [facility]      Albertine Grates, FNP

## 2022-07-20 ENCOUNTER — Other Ambulatory Visit: Payer: Self-pay | Admitting: Women's Health

## 2022-07-20 DIAGNOSIS — O09293 Supervision of pregnancy with other poor reproductive or obstetric history, third trimester: Secondary | ICD-10-CM

## 2022-07-24 ENCOUNTER — Encounter: Payer: BC Managed Care – PPO | Admitting: Advanced Practice Midwife

## 2022-07-31 ENCOUNTER — Other Ambulatory Visit (HOSPITAL_COMMUNITY)
Admission: RE | Admit: 2022-07-31 | Discharge: 2022-07-31 | Disposition: A | Discharge status: Home / Self Care | Payer: BC Managed Care – PPO | Source: Ambulatory Visit | Attending: Obstetrics and Gynecology | Admitting: Obstetrics and Gynecology

## 2022-07-31 ENCOUNTER — Ambulatory Visit (INDEPENDENT_AMBULATORY_CARE_PROVIDER_SITE_OTHER): Payer: BC Managed Care – PPO

## 2022-07-31 ENCOUNTER — Ambulatory Visit (INDEPENDENT_AMBULATORY_CARE_PROVIDER_SITE_OTHER): Payer: BC Managed Care – PPO | Admitting: Obstetrics and Gynecology

## 2022-07-31 ENCOUNTER — Encounter: Payer: Self-pay | Admitting: Obstetrics and Gynecology

## 2022-07-31 VITALS — BP 116/74 | HR 64 | Wt 163.3 lb

## 2022-07-31 DIAGNOSIS — Z3493 Encounter for supervision of normal pregnancy, unspecified, third trimester: Secondary | ICD-10-CM | POA: Insufficient documentation

## 2022-07-31 DIAGNOSIS — Z3A36 36 weeks gestation of pregnancy: Secondary | ICD-10-CM

## 2022-07-31 DIAGNOSIS — Z3483 Encounter for supervision of other normal pregnancy, third trimester: Secondary | ICD-10-CM

## 2022-07-31 DIAGNOSIS — O09293 Supervision of pregnancy with other poor reproductive or obstetric history, third trimester: Secondary | ICD-10-CM

## 2022-07-31 DIAGNOSIS — Z3480 Encounter for supervision of other normal pregnancy, unspecified trimester: Secondary | ICD-10-CM

## 2022-07-31 NOTE — Progress Notes (Signed)
Subjective:  Brittney Collins is a 32 y.o. G2P1001 at [redacted]w[redacted]d being seen today for ongoing prenatal care.  She is currently monitored for the following issues for this low-risk pregnancy and has Right ovarian cyst and Encounter for supervision of normal pregnancy, antepartum on their problem list.  Patient reports  general discomforts of pregnancy .  Contractions: Not present.  .  Movement: Present. Denies leaking of fluid.   The following portions of the patient's history were reviewed and updated as appropriate: allergies, current medications, past family history, past medical history, past social history, past surgical history and problem list. Problem list updated.  Objective:   Vitals:   07/31/22 1640  BP: 116/74  Pulse: 64  Weight: 163 lb 4.8 oz (74.1 kg)    Fetal Status: Fetal Heart Rate (bpm): U/S   Movement: Present     General:  Alert, oriented and cooperative. Patient is in no acute distress.  Skin: Skin is warm and dry. No rash noted.   Cardiovascular: Normal heart rate noted  Respiratory: Normal respiratory effort, no problems with respiration noted  Abdomen: Soft, gravid, appropriate for gestational age. Pain/Pressure: Absent     Pelvic:  Cervical exam performed        Extremities: Normal range of motion.  Edema: Trace  Mental Status: Normal mood and affect. Normal behavior. Normal judgment and thought content.   Urinalysis:      Assessment and Plan:  Pregnancy: G2P1001 at [redacted]w[redacted]d  1. Supervision of other normal pregnancy, antepartum Labor precautions GBS and vaginal cultures obtained Malpresentation reviewed with pt  2. [redacted] weeks gestation of pregnancy  - Culture, beta strep (group b only) - Cervicovaginal ancillary only( [place])  Preterm labor symptoms and general obstetric precautions including but not limited to vaginal bleeding, contractions, leaking of fluid and fetal movement were reviewed in detail with the patient. Please refer to After Visit  Summary for other counseling recommendations.  Return in about 1 week (around 08/07/2022) for OB visit, face to face, any provider.   Hermina Staggers, MD

## 2022-07-31 NOTE — Progress Notes (Signed)
[name] 36+3 wks,complete breech,right lateral placenta gr 2,CX 3.7 cm,AFI 19 cm,EFW 3263 g 82%,FL .6%

## 2022-08-01 ENCOUNTER — Encounter: Payer: Self-pay | Admitting: Obstetrics and Gynecology

## 2022-08-01 DIAGNOSIS — Z3493 Encounter for supervision of normal pregnancy, unspecified, third trimester: Secondary | ICD-10-CM | POA: Diagnosis not present

## 2022-08-01 DIAGNOSIS — Z3A36 36 weeks gestation of pregnancy: Secondary | ICD-10-CM | POA: Diagnosis not present

## 2022-08-02 LAB — CERVICOVAGINAL ANCILLARY ONLY
Chlamydia: NEGATIVE
Comment: NEGATIVE
Comment: NORMAL
Neisseria Gonorrhea: NEGATIVE

## 2022-08-04 LAB — CULTURE, BETA STREP (GROUP B ONLY): Strep Gp B Culture: NEGATIVE

## 2022-08-07 ENCOUNTER — Ambulatory Visit (INDEPENDENT_AMBULATORY_CARE_PROVIDER_SITE_OTHER): Payer: BC Managed Care – PPO | Admitting: Advanced Practice Midwife

## 2022-08-07 ENCOUNTER — Encounter: Payer: Self-pay | Admitting: Advanced Practice Midwife

## 2022-08-07 VITALS — BP 116/73 | HR 75 | Wt 162.0 lb

## 2022-08-07 DIAGNOSIS — Z3483 Encounter for supervision of other normal pregnancy, third trimester: Secondary | ICD-10-CM

## 2022-08-07 DIAGNOSIS — Z3480 Encounter for supervision of other normal pregnancy, unspecified trimester: Secondary | ICD-10-CM

## 2022-08-07 DIAGNOSIS — Z3A37 37 weeks gestation of pregnancy: Secondary | ICD-10-CM

## 2022-08-07 NOTE — Progress Notes (Signed)
   LOW-RISK PREGNANCY VISIT Patient name: Brittney Collins MRN [id number]  Date of birth: [birth year] Chief Complaint:   Routine Prenatal Visit  History of Present Illness:   Brittney Collins is a 32 y.o. G2P1001 female at [redacted]w[redacted]d with an Estimated Date of Delivery: 08/25/22 being seen today for ongoing management of a low-risk pregnancy.  Today she reports no complaints. Contractions: Not present.  .  Movement: Present. denies leaking of fluid. Review of Systems:   Pertinent items are noted in HPI Denies abnormal vaginal discharge w/ itching/odor/irritation, headaches, visual changes, shortness of breath, chest pain, abdominal pain, severe nausea/vomiting, or problems with urination or bowel movements unless otherwise stated above. Pertinent History Reviewed:  Reviewed past medical,surgical, social, obstetrical and family history.  Reviewed problem list, medications and allergies. Physical Assessment:   Vitals:   08/07/22 1040  BP: 116/73  Pulse: 75  Weight: 162 lb (73.5 kg)  Body mass index is 26.96 kg/m.        Physical Examination:   General appearance: Well appearing, and in no distress  Mental status: Alert, oriented to person, place, and time  Skin: Warm & dry  Cardiovascular: Normal heart rate noted  Respiratory: Normal respiratory effort, no distress  Abdomen: Soft, gravid, nontender  Pelvic: Cervical exam deferred         Extremities: Edema: Trace  Fetal Status:     Movement: Present    Chaperone:  N/A    No results found for this or any previous visit (from the past 24 hour(s)).  Assessment & Plan:    Pregnancy: G2P1001 at [redacted]w[redacted]d 1. Supervision of other normal pregnancy, antepartum   2. [redacted] weeks gestation of pregnancy  3.  Complete Breech:  discussed ECV vs CS;  pt was counseled that she would have to have an epidural for ECV and didn't want that.  Discussed that it is optional if too painful; now leaning towards ECV (would schedule Wednesday w/[facility]).   Will let me know by this afternoon    Meds: No orders of the defined types were placed in this encounter.  Labs/procedures today: bedside US shows baby still breech  Plan:  Continue routine obstetrical care  Next visit: prefers in person    Reviewed: Term labor symptoms and general obstetric precautions including but not limited to vaginal bleeding, contractions, leaking of fluid and fetal movement were reviewed in detail with the patient.  All questions were answered. Has home bp cuff.. Check bp weekly, let us know if >140/90.   Follow-up: Return for As scheduled.  Future Appointments  Date Time Provider Department Center  08/14/2022  4:10 PM Cheral Marker, [place] [facility]  08/21/2022  4:10 PM Cheral Marker, CNM [facility]    No orders of the defined types were placed in this encounter.  Jacklyn Shell DNP, CNM 08/07/2022 11:00 AM

## 2022-08-10 ENCOUNTER — Telehealth (HOSPITAL_COMMUNITY): Payer: Self-pay | Admitting: *Deleted

## 2022-08-10 ENCOUNTER — Encounter (HOSPITAL_COMMUNITY): Payer: Self-pay

## 2022-08-10 NOTE — Telephone Encounter (Signed)
Preadmission screen  

## 2022-08-10 NOTE — Patient Instructions (Signed)
Ja[facility] Al[facility]  08/10/2022   Your procedure is scheduled on:  08/19/2022  Arrive at 0530 at Entrance C on [employer] at [facility]  and [employer]. You are invited to use the FREE valet parking or use the Visitor's parking deck.  Pick up the phone at the desk and dial [phone].  Call this number if you have problems the morning of surgery: [phone]  Remember:   Do not eat food:(After Midnight) Desps de medianoche.  Do not drink clear liquids: (After Midnight) Desps de medianoche.  Take these medicines the morning of surgery with A SIP OF WATER:  none   Do not wear jewelry, make-up or nail polish.  Do not wear lotions, powders, or perfumes. Do not wear deodorant.  Do not shave 48 hours prior to surgery.  Do not bring valuables to the hospital.  [facility] is not   responsible for any belongings or valuables brought to the hospital.  Contacts, dentures or bridgework may not be worn into surgery.  Leave suitcase in the car. After surgery it may be brought to your room.  For patients admitted to the hospital, checkout time is 11:00 AM the day of              discharge.      Please read over the following fact sheets that you were given:     Preparing for Surgery

## 2022-08-11 ENCOUNTER — Encounter (HOSPITAL_COMMUNITY): Payer: Self-pay

## 2022-08-14 ENCOUNTER — Ambulatory Visit (INDEPENDENT_AMBULATORY_CARE_PROVIDER_SITE_OTHER): Payer: BC Managed Care – PPO | Admitting: Women's Health

## 2022-08-14 ENCOUNTER — Encounter: Payer: Self-pay | Admitting: Women's Health

## 2022-08-14 VITALS — BP 131/78 | HR 82 | Wt 162.0 lb

## 2022-08-14 DIAGNOSIS — Z3480 Encounter for supervision of other normal pregnancy, unspecified trimester: Secondary | ICD-10-CM

## 2022-08-14 DIAGNOSIS — O321XX0 Maternal care for breech presentation, not applicable or unspecified: Secondary | ICD-10-CM

## 2022-08-14 DIAGNOSIS — Z3483 Encounter for supervision of other normal pregnancy, third trimester: Secondary | ICD-10-CM

## 2022-08-14 DIAGNOSIS — O0990 Supervision of high risk pregnancy, unspecified, unspecified trimester: Secondary | ICD-10-CM

## 2022-08-14 DIAGNOSIS — Z3A38 38 weeks gestation of pregnancy: Secondary | ICD-10-CM

## 2022-08-14 NOTE — Progress Notes (Signed)
    LOW-RISK PREGNANCY VISIT Patient name: Brittney Collins MRN [id number]  Date of birth: 07-20-1990 Chief Complaint:   Routine Prenatal Visit  History of Present Illness:   Brittney Collins is a 32 y.o. G2P1001 female at [redacted]w[redacted]d with an Estimated Date of Delivery: 08/25/22 being seen today for ongoing management of a low-risk pregnancy.   Today she reports no complaints. Contractions: Not present.  .  Movement: Present. denies leaking of fluid.     02/20/2022    9:56 AM  Depression screen PHQ 2/9  Decreased Interest 0  Down, Depressed, Hopeless 0  PHQ - 2 Score 0  Altered sleeping 0  Tired, decreased energy 0  Change in appetite 0  Feeling bad or failure about yourself  0  Trouble concentrating 0  Moving slowly or fidgety/restless 0  Suicidal thoughts 0  PHQ-9 Score 0        02/20/2022    9:56 AM  GAD 7 : Generalized Anxiety Score  Nervous, Anxious, on Edge 0  Control/stop worrying 0  Worry too much - different things 0  Trouble relaxing 0  Restless 0  Easily annoyed or irritable 0  Afraid - awful might happen 0  Total GAD 7 Score 0      Review of Systems:   Pertinent items are noted in HPI Denies abnormal vaginal discharge w/ itching/odor/irritation, headaches, visual changes, shortness of breath, chest pain, abdominal pain, severe nausea/vomiting, or problems with urination or bowel movements unless otherwise stated above. Pertinent History Reviewed:  Reviewed past medical,surgical, social, obstetrical and family history.  Reviewed problem list, medications and allergies. Physical Assessment:   Vitals:   08/14/22 1607  BP: 131/78  Pulse: 82  Weight: 162 lb (73.5 kg)  Body mass index is 26.96 kg/m.        Physical Examination:   General appearance: Well appearing, and in no distress  Mental status: Alert, oriented to person, place, and time  Skin: Warm & dry  Cardiovascular: Normal heart rate noted  Respiratory: Normal respiratory effort, no  distress  Abdomen: Soft, gravid, nontender  Pelvic: Cervical exam deferred         Extremities: Edema: None  Fetal Status: Fetal Heart Rate (bpm): 136 Fundal Height: 37 cm Movement: Present    Chaperone: N/A   No results found for this or any previous visit (from the past 24 hour(s)).  Assessment & Plan:  1) Low-risk pregnancy G2P1001 at [redacted]w[redacted]d with an Estimated Date of Delivery: 08/25/22   2) Breech, declined ECV, PCS already scheduled for 7/6   Meds: No orders of the defined types were placed in this encounter.  Labs/procedures today: none  Reviewed: Term labor symptoms and general obstetric precautions including but not limited to vaginal bleeding, contractions, leaking of fluid and fetal movement were reviewed in detail with the patient.  All questions were answered. Does have home bp cuff. Office bp cuff given: not applicable. Check bp weekly, let us know if consistently >140 and/or >90.  Follow-up: Return for cancel 7/8.  Future Appointments  Date Time Provider Department Center  08/18/2022  9:00 AM [facility] PAT 1 [facility] None  08/21/2022  4:10 PM Cheral Marker, CNM [facility]    No orders of the defined types were placed in this encounter.  Cheral Marker CNM, [facility] 08/14/2022 4:37 PM

## 2022-08-14 NOTE — Patient Instructions (Signed)
Mardella Layman, thank you for choosing our office today! We appreciate the opportunity to meet your healthcare needs. You may receive a short survey by mail, e-mail, or through [employer]. If you are happy with your care we would appreciate if you could take just a few minutes to complete the survey questions. We read all of your comments and take your feedback very seriously. Thank you again for choosing our office.  Center for [employer] Team at [facility]  [facility] at [facility] ([address] [place], [place] [address]) Entrance C, located off of [address] Free 24/7 valet parking   CLASSES: Go to [email or web address] to register for classes (childbirth, breastfeeding, waterbirth, infant CPR, daddy bootcamp, etc.)  Call the office [phone]) or go to [facility] if: You begin to have strong, frequent contractions Your water breaks.  Sometimes it is a big gush of fluid, sometimes it is just a trickle that keeps getting your panties wet or running down your legs You have vaginal bleeding.  It is normal to have a small amount of spotting if your cervix was checked.  You don't feel your baby moving like normal.  If you don't, get you something to eat and drink and lay down and focus on feeling your baby move.   If your baby is still not moving like normal, you should call the office or go to [facility].  Call the office [phone]) or go to [facility] hospital for these signs of pre-eclampsia: Severe headache that does not go away with Tylenol Visual changes- seeing spots, double, blurred vision Pain under your right breast or upper abdomen that does not go away with Tums or heartburn medicine Nausea and/or vomiting Severe swelling in your hands, feet, and face   [facility] Pediatricians/Family Doctors [facility]): [address] Colette Ribas, [phone]           [facility]: [address], [phone]                 [facility]): [address], [phone] (call to ask if accepting patients) [facility] Department: [address], [place], [phone]    [facility] Pediatricians/Family Doctors [facility]): [address], [phone] [facility]: [address] [place], [phone] [facility]: [address], [phone]  [facility] Doctors  [facility]): [phone] [facility]: [address] [phone]   [facility] Doctors [facility]: [address], [phone]  [facility] Doctors  [facility]: [phone], [phone]  Home Blood Pressure Monitoring for Patients   Your provider has recommended that you check your blood pressure (BP) at least once a week at home. If you do not have a blood pressure cuff at home, one will be provided for you. Contact your provider if you have not received your monitor within 1 week.   Helpful Tips for Accurate Home Blood Pressure Checks  Don't smoke, exercise, or drink caffeine 30 minutes before checking your BP Use the restroom before checking your BP (a full bladder can raise your pressure) Relax in a comfortable upright chair Feet on the ground Left arm resting comfortably on a flat surface at the level of your heart Legs uncrossed Back supported Sit quietly and don't talk Place the cuff on your bare arm Adjust snuggly, so that only two fingertips  can fit between your skin and the top of the cuff Check 2 readings separated by at least one minute Keep a log of your BP readings For a visual, please reference this diagram: http://ccnc.care/bpdiagram  Provider Name: [facility]     Phone: [phone]  Zone 1: ALL CLEAR  Continue to monitor your symptoms:  BP reading is less than 140 (top number) or less than 90 (bottom number)  No right  upper stomach pain No headaches or seeing spots No feeling nauseated or throwing up No swelling in face and hands  Zone 2: CAUTION Call your doctor's office for any of the following:  BP reading is greater than 140 (top number) or greater than 90 (bottom number)  Stomach pain under your ribs in the middle or right side Headaches or seeing spots Feeling nauseated or throwing up Swelling in face and hands  Zone 3: EMERGENCY  Seek immediate medical care if you have any of the following:  BP reading is greater than160 (top number) or greater than 110 (bottom number) Severe headaches not improving with Tylenol Serious difficulty catching your breath Any worsening symptoms from Zone 2   Braxton Hicks Contractions Contractions of the uterus can occur throughout pregnancy, but they are not always a sign that you are in labor. You may have practice contractions called Braxton Hicks contractions. These false labor contractions are sometimes confused with true labor. What are [place] contractions? Braxton Hicks contractions are tightening movements that occur in the muscles of the uterus before labor. Unlike true labor contractions, these contractions do not result in opening (dilation) and thinning of the cervix. Toward the end of pregnancy (32-34 weeks), Braxton Hicks contractions can happen more often and may become stronger. These contractions are sometimes difficult to tell apart from true labor because they can be very uncomfortable. You should not feel embarrassed if you go to the hospital with false labor. Sometimes, the only way to tell if you are in true labor is for your health care provider to look for changes in the cervix. The health care provider will do a physical exam and may monitor your contractions. If you are not in true labor, the exam should show that your cervix is not dilating and your water has not broken. If there are no other health problems associated with your  pregnancy, it is completely safe for you to be sent home with false labor. You may continue to have Braxton Hicks contractions until you go into true labor. How to tell the difference between true labor and false labor True labor Contractions last 30-70 seconds. Contractions become very regular. Discomfort is usually felt in the top of the uterus, and it spreads to the lower abdomen and low back. Contractions do not go away with walking. Contractions usually become more intense and increase in frequency. The cervix dilates and gets thinner. False labor Contractions are usually shorter and not as strong as true labor contractions. Contractions are usually irregular. Contractions are often felt in the front of the lower abdomen and in the groin. Contractions may go away when you walk around or change positions while lying down. Contractions get weaker and are shorter-lasting as time goes on. The cervix usually does not dilate or become thin. Follow these instructions at home:  Take over-the-counter and prescription medicines only as told by your health care provider. Keep up with your usual exercises and follow other instructions from your health care provider. Eat and drink lightly if you think  you are going into labor. If Braxton Hicks contractions are making you uncomfortable: Change your position from lying down or resting to walking, or change from walking to resting. Sit and rest in a tub of warm water. Drink enough fluid to keep your urine pale yellow. Dehydration may cause these contractions. Do slow and deep breathing several times an hour. Keep all follow-up prenatal visits as told by your health care provider. This is important. Contact a health care provider if: You have a fever. You have continuous pain in your abdomen. Get help right away if: Your contractions become stronger, more regular, and closer together. You have fluid leaking or gushing from your vagina. You pass  blood-tinged mucus (bloody show). You have bleeding from your vagina. You have low back pain that you never had before. You feel your baby's head pushing down and causing pelvic pressure. Your baby is not moving inside you as much as it used to. Summary Contractions that occur before labor are called Braxton Hicks contractions, false labor, or practice contractions. Braxton Hicks contractions are usually shorter, weaker, farther apart, and less regular than true labor contractions. True labor contractions usually become progressively stronger and regular, and they become more frequent. Manage discomfort from [facility] contractions by changing position, resting in a warm bath, drinking plenty of water, or practicing deep breathing. This information is not intended to replace advice given to you by your health care provider. Make sure you discuss any questions you have with your health care provider. Document Revised: 01/12/2017 Document Reviewed: 06/15/2016 Elsevier Patient Education  [place].

## 2022-08-16 ENCOUNTER — Other Ambulatory Visit: Payer: Self-pay | Admitting: Obstetrics & Gynecology

## 2022-08-16 DIAGNOSIS — Z01818 Encounter for other preprocedural examination: Secondary | ICD-10-CM

## 2022-08-18 ENCOUNTER — Encounter: Payer: Self-pay | Admitting: Women's Health

## 2022-08-18 ENCOUNTER — Encounter (HOSPITAL_COMMUNITY)
Admission: RE | Admit: 2022-08-18 | Discharge: 2022-08-18 | Disposition: A | Discharge status: Home / Self Care | Payer: BC Managed Care – PPO | Source: Ambulatory Visit | Attending: Family Medicine | Admitting: Family Medicine

## 2022-08-18 DIAGNOSIS — Z01812 Encounter for preprocedural laboratory examination: Secondary | ICD-10-CM | POA: Diagnosis not present

## 2022-08-18 DIAGNOSIS — O321XX0 Maternal care for breech presentation, not applicable or unspecified: Secondary | ICD-10-CM | POA: Diagnosis not present

## 2022-08-18 DIAGNOSIS — O3483 Maternal care for other abnormalities of pelvic organs, third trimester: Secondary | ICD-10-CM | POA: Diagnosis not present

## 2022-08-18 DIAGNOSIS — N83201 Unspecified ovarian cyst, right side: Secondary | ICD-10-CM | POA: Diagnosis not present

## 2022-08-18 DIAGNOSIS — Z3A39 39 weeks gestation of pregnancy: Secondary | ICD-10-CM | POA: Diagnosis not present

## 2022-08-18 DIAGNOSIS — Z01818 Encounter for other preprocedural examination: Secondary | ICD-10-CM

## 2022-08-18 LAB — RAPID HIV SCREEN (HIV 1/2 AB+AG)
HIV 1/2 Antibodies: NONREACTIVE
HIV-1 P24 Antigen - HIV24: NONREACTIVE

## 2022-08-18 LAB — CBC
HCT: 37.3 % (ref 36.0–46.0)
Hemoglobin: 12.4 g/dL (ref 12.0–15.0)
MCH: 32.1 pg (ref 26.0–34.0)
MCHC: 33.2 g/dL (ref 30.0–36.0)
MCV: 96.6 fL (ref 80.0–100.0)
Platelets: 216 10*3/uL (ref 150–400)
RBC: 3.86 MIL/uL — ABNORMAL LOW (ref 3.87–5.11)
RDW: 14.1 % (ref 11.5–15.5)
WBC: 11.2 10*3/uL — ABNORMAL HIGH (ref 4.0–10.5)
nRBC: 0 % (ref 0.0–0.2)

## 2022-08-18 LAB — TYPE AND SCREEN
ABO/RH(D): A POS
Antibody Screen: NEGATIVE

## 2022-08-18 NOTE — Anesthesia Preprocedure Evaluation (Signed)
Anesthesia Evaluation  Patient identified by MRN, date of birth, ID band Patient awake    Reviewed: Allergy & Precautions, H&P , NPO status , Patient's Chart, lab work & pertinent test results  Airway Mallampati: I  TM Distance: >3 FB Neck ROM: Full    Dental no notable dental hx. (+) Teeth Intact, Dental Advisory Given   Pulmonary neg pulmonary ROS   Pulmonary exam normal breath sounds clear to auscultation       Cardiovascular Exercise Tolerance: Good negative cardio ROS Normal cardiovascular exam Rhythm:Regular Rate:Normal     Neuro/Psych negative neurological ROS  negative psych ROS   GI/Hepatic negative GI ROS, Neg liver ROS,,,  Endo/Other  negative endocrine ROS    Renal/GU negative Renal ROS  negative genitourinary   Musculoskeletal negative musculoskeletal ROS (+)    Abdominal   Peds negative pediatric ROS (+)  Hematology negative hematology ROS (+) Lab Results      Component                Value               Date                      WBC                      11.2 (H)            08/18/2022                HGB                      12.4                08/18/2022                HCT                      37.3                08/18/2022                MCV                      96.6                08/18/2022                PLT                      216                 08/18/2022       T&S available         Anesthesia Other Findings NKDA  Reproductive/Obstetrics negative OB ROS (+) Pregnancy                             Anesthesia Physical Anesthesia Plan  ASA: 2  Anesthesia Plan: Spinal   Post-op Pain Management: Regional block*, Ofirmev IV (intra-op)* and Toradol IV (intra-op)*   Induction: Intravenous  PONV Risk Score and Plan: 2 and Treatment may vary due to age or medical condition, Ondansetron and Dexamethasone  Airway Management Planned: Nasal Cannula, Natural Airway and  Simple Face Mask  Additional Equipment: None  Intra-op Plan:   Post-operative Plan:   Informed Consent:  I have reviewed the patients History and Physical, chart, labs and discussed the procedure including the risks, benefits and alternatives for the proposed anesthesia with the patient or authorized representative who has indicated his/her understanding and acceptance.       Plan Discussed with: Anesthesiologist and CRNA  Anesthesia Plan Comments: (39.1 wk G2P1 for Primary c/s for Breech)        Anesthesia Quick Evaluation

## 2022-08-19 ENCOUNTER — Inpatient Hospital Stay (HOSPITAL_COMMUNITY): Payer: BC Managed Care – PPO | Admitting: Anesthesiology

## 2022-08-19 ENCOUNTER — Encounter (HOSPITAL_COMMUNITY): Payer: Self-pay | Admitting: Obstetrics & Gynecology

## 2022-08-19 ENCOUNTER — Inpatient Hospital Stay (HOSPITAL_COMMUNITY)
Admission: AD | Admit: 2022-08-19 | Discharge: 2022-08-21 | DRG: 788 | Disposition: A | Discharge status: Home / Self Care | Payer: BC Managed Care – PPO | Attending: Obstetrics & Gynecology | Admitting: Obstetrics & Gynecology

## 2022-08-19 ENCOUNTER — Encounter (HOSPITAL_COMMUNITY)
Admission: AD | Disposition: A | Discharge status: Home / Self Care | Payer: Self-pay | Source: Home / Self Care | Attending: Obstetrics & Gynecology

## 2022-08-19 ENCOUNTER — Other Ambulatory Visit: Payer: Self-pay

## 2022-08-19 DIAGNOSIS — O3483 Maternal care for other abnormalities of pelvic organs, third trimester: Secondary | ICD-10-CM | POA: Diagnosis present

## 2022-08-19 DIAGNOSIS — N83201 Unspecified ovarian cyst, right side: Secondary | ICD-10-CM | POA: Diagnosis present

## 2022-08-19 DIAGNOSIS — Z98891 History of uterine scar from previous surgery: Principal | ICD-10-CM

## 2022-08-19 DIAGNOSIS — Z3490 Encounter for supervision of normal pregnancy, unspecified, unspecified trimester: Secondary | ICD-10-CM

## 2022-08-19 DIAGNOSIS — O321XX0 Maternal care for breech presentation, not applicable or unspecified: Principal | ICD-10-CM

## 2022-08-19 DIAGNOSIS — Z3A39 39 weeks gestation of pregnancy: Secondary | ICD-10-CM | POA: Diagnosis not present

## 2022-08-19 DIAGNOSIS — O165 Unspecified maternal hypertension, complicating the puerperium: Secondary | ICD-10-CM

## 2022-08-19 DIAGNOSIS — Z01812 Encounter for preprocedural laboratory examination: Secondary | ICD-10-CM | POA: Diagnosis not present

## 2022-08-19 LAB — RPR: RPR Ser Ql: NONREACTIVE

## 2022-08-19 SURGERY — Surgical Case
Anesthesia: Spinal

## 2022-08-19 MED ORDER — ACETAMINOPHEN 500 MG PO TABS
1000.0000 mg | ORAL_TABLET | Freq: Four times a day (QID) | ORAL | Status: DC
  Administered 2022-08-19 – 2022-08-20 (×2): 1000 mg via ORAL
  Filled 2022-08-19 (×2): qty 2

## 2022-08-19 MED ORDER — ONDANSETRON HCL 4 MG/2ML IJ SOLN: 4.0000 mg | Freq: Once | INTRAMUSCULAR | Status: DC | PRN

## 2022-08-19 MED ORDER — DEXMEDETOMIDINE HCL IN NACL 80 MCG/20ML IV SOLN
INTRAVENOUS | Status: DC | PRN
  Administered 2022-08-19: 12 ug via INTRAVENOUS

## 2022-08-19 MED ORDER — KETOROLAC TROMETHAMINE 30 MG/ML IJ SOLN
30.0000 mg | Freq: Four times a day (QID) | INTRAMUSCULAR | Status: DC
  Administered 2022-08-19 – 2022-08-20 (×2): 30 mg via INTRAVENOUS
  Filled 2022-08-19 (×3): qty 1

## 2022-08-19 MED ORDER — COCONUT OIL OIL: 1.0000 | TOPICAL_OIL | Status: DC | PRN

## 2022-08-19 MED ORDER — SODIUM CHLORIDE 0.9% FLUSH: 3.0000 mL | INTRAVENOUS | Status: DC | PRN

## 2022-08-19 MED ORDER — ONDANSETRON HCL 4 MG/2ML IJ SOLN
INTRAMUSCULAR | Status: DC | PRN
  Administered 2022-08-19: 4 mg via INTRAVENOUS

## 2022-08-19 MED ORDER — CEFAZOLIN SODIUM-DEXTROSE 2-4 GM/100ML-% IV SOLN
2.0000 g | INTRAVENOUS | Status: AC
  Administered 2022-08-19: 2 g via INTRAVENOUS

## 2022-08-19 MED ORDER — OXYTOCIN-SODIUM CHLORIDE 30-0.9 UT/500ML-% IV SOLN
INTRAVENOUS | Status: AC
  Filled 2022-08-19: qty 500

## 2022-08-19 MED ORDER — OXYCODONE HCL 5 MG PO TABS
5.0000 mg | ORAL_TABLET | ORAL | Status: DC | PRN
  Administered 2022-08-20 (×2): 5 mg via ORAL
  Filled 2022-08-19 (×2): qty 1

## 2022-08-19 MED ORDER — MAGNESIUM HYDROXIDE 400 MG/5ML PO SUSP: 30.0000 mL | ORAL | Status: DC | PRN

## 2022-08-19 MED ORDER — POVIDONE-IODINE 10 % EX SWAB: 2.0000 | Freq: Once | CUTANEOUS | Status: DC

## 2022-08-19 MED ORDER — NALOXONE HCL 0.4 MG/ML IJ SOLN: 0.4000 mg | INTRAMUSCULAR | Status: DC | PRN

## 2022-08-19 MED ORDER — SCOPOLAMINE 1 MG/3DAYS TD PT72
MEDICATED_PATCH | TRANSDERMAL | Status: AC
  Filled 2022-08-19: qty 1

## 2022-08-19 MED ORDER — LACTATED RINGERS IV SOLN: INTRAVENOUS | Status: DC

## 2022-08-19 MED ORDER — OXYCODONE HCL 5 MG PO TABS: 5.0000 mg | ORAL_TABLET | Freq: Once | ORAL | Status: DC | PRN

## 2022-08-19 MED ORDER — KETOROLAC TROMETHAMINE 30 MG/ML IJ SOLN: 30.0000 mg | Freq: Once | INTRAMUSCULAR | Status: DC | PRN

## 2022-08-19 MED ORDER — SIMETHICONE 80 MG PO CHEW
80.0000 mg | CHEWABLE_TABLET | Freq: Three times a day (TID) | ORAL | Status: DC
  Administered 2022-08-19 – 2022-08-21 (×7): 80 mg via ORAL
  Filled 2022-08-19 (×7): qty 1

## 2022-08-19 MED ORDER — ENOXAPARIN SODIUM 40 MG/0.4ML IJ SOSY
40.0000 mg | PREFILLED_SYRINGE | INTRAMUSCULAR | Status: DC
  Administered 2022-08-20: 40 mg via SUBCUTANEOUS
  Filled 2022-08-19 (×2): qty 0.4

## 2022-08-19 MED ORDER — PHENYLEPHRINE 80 MCG/ML (10ML) SYRINGE FOR IV PUSH (FOR BLOOD PRESSURE SUPPORT)
PREFILLED_SYRINGE | INTRAVENOUS | Status: AC
  Filled 2022-08-19: qty 10

## 2022-08-19 MED ORDER — DIPHENHYDRAMINE HCL 25 MG PO CAPS: 25.0000 mg | ORAL_CAPSULE | Freq: Four times a day (QID) | ORAL | Status: DC | PRN

## 2022-08-19 MED ORDER — BUPIVACAINE IN DEXTROSE 0.75-8.25 % IT SOLN
INTRATHECAL | Status: DC | PRN
  Administered 2022-08-19: 12 mg via INTRATHECAL

## 2022-08-19 MED ORDER — CEFAZOLIN SODIUM-DEXTROSE 2-4 GM/100ML-% IV SOLN
INTRAVENOUS | Status: AC
  Filled 2022-08-19: qty 100

## 2022-08-19 MED ORDER — ONDANSETRON HCL 4 MG/2ML IJ SOLN: 4.0000 mg | Freq: Three times a day (TID) | INTRAMUSCULAR | Status: DC | PRN

## 2022-08-19 MED ORDER — IBUPROFEN 600 MG PO TABS: 600.0000 mg | ORAL_TABLET | Freq: Four times a day (QID) | ORAL | Status: DC

## 2022-08-19 MED ORDER — ACETAMINOPHEN 10 MG/ML IV SOLN
INTRAVENOUS | Status: DC | PRN
  Administered 2022-08-19: 1000 mg via INTRAVENOUS

## 2022-08-19 MED ORDER — SENNOSIDES-DOCUSATE SODIUM 8.6-50 MG PO TABS
2.0000 | ORAL_TABLET | Freq: Every day | ORAL | Status: DC
  Administered 2022-08-20 – 2022-08-21 (×2): 2 via ORAL
  Filled 2022-08-19 (×2): qty 2

## 2022-08-19 MED ORDER — KETOROLAC TROMETHAMINE 30 MG/ML IJ SOLN
INTRAMUSCULAR | Status: DC | PRN
  Administered 2022-08-19: 30 mg via INTRAVENOUS

## 2022-08-19 MED ORDER — GABAPENTIN 100 MG PO CAPS: 200.0000 mg | ORAL_CAPSULE | Freq: Every day | ORAL | Status: DC

## 2022-08-19 MED ORDER — FENTANYL CITRATE (PF) 100 MCG/2ML IJ SOLN
INTRAMUSCULAR | Status: AC
  Filled 2022-08-19: qty 2

## 2022-08-19 MED ORDER — DEXMEDETOMIDINE HCL IN NACL 80 MCG/20ML IV SOLN
INTRAVENOUS | Status: AC
  Filled 2022-08-19: qty 20

## 2022-08-19 MED ORDER — WITCH HAZEL-GLYCERIN EX PADS: 1.0000 | MEDICATED_PAD | CUTANEOUS | Status: DC | PRN

## 2022-08-19 MED ORDER — DIBUCAINE (PERIANAL) 1 % EX OINT: 1.0000 | TOPICAL_OINTMENT | CUTANEOUS | Status: DC | PRN

## 2022-08-19 MED ORDER — DIPHENHYDRAMINE HCL 50 MG/ML IJ SOLN: 12.5000 mg | INTRAMUSCULAR | Status: DC | PRN

## 2022-08-19 MED ORDER — OXYTOCIN-SODIUM CHLORIDE 30-0.9 UT/500ML-% IV SOLN
2.5000 [IU]/h | INTRAVENOUS | Status: AC
  Administered 2022-08-19 (×2): 2.5 [IU]/h via INTRAVENOUS
  Filled 2022-08-19: qty 500

## 2022-08-19 MED ORDER — OXYCODONE HCL 5 MG/5ML PO SOLN: 5.0000 mg | Freq: Once | ORAL | Status: DC | PRN

## 2022-08-19 MED ORDER — MORPHINE SULFATE (PF) 0.5 MG/ML IJ SOLN
INTRAMUSCULAR | Status: AC
  Filled 2022-08-19: qty 10

## 2022-08-19 MED ORDER — ACETAMINOPHEN 10 MG/ML IV SOLN
INTRAVENOUS | Status: AC
  Filled 2022-08-19: qty 100

## 2022-08-19 MED ORDER — MENTHOL 3 MG MT LOZG: 1.0000 | LOZENGE | OROMUCOSAL | Status: DC | PRN

## 2022-08-19 MED ORDER — OXYTOCIN-SODIUM CHLORIDE 30-0.9 UT/500ML-% IV SOLN
INTRAVENOUS | Status: DC | PRN
  Administered 2022-08-19: 30 [IU] via INTRAVENOUS

## 2022-08-19 MED ORDER — ZOLPIDEM TARTRATE 5 MG PO TABS: 5.0000 mg | ORAL_TABLET | Freq: Every evening | ORAL | Status: DC | PRN

## 2022-08-19 MED ORDER — MORPHINE SULFATE (PF) 0.5 MG/ML IJ SOLN
INTRAMUSCULAR | Status: DC | PRN
  Administered 2022-08-19: 150 ug via INTRATHECAL

## 2022-08-19 MED ORDER — SCOPOLAMINE 1 MG/3DAYS TD PT72
1.0000 | MEDICATED_PATCH | Freq: Once | TRANSDERMAL | Status: DC
  Administered 2022-08-19: 1.5 mg via TRANSDERMAL

## 2022-08-19 MED ORDER — MEPERIDINE HCL 25 MG/ML IJ SOLN: 6.2500 mg | INTRAMUSCULAR | Status: DC | PRN

## 2022-08-19 MED ORDER — HYDROMORPHONE HCL 1 MG/ML IJ SOLN: 0.2500 mg | INTRAMUSCULAR | Status: DC | PRN

## 2022-08-19 MED ORDER — PHENYLEPHRINE HCL-NACL 20-0.9 MG/250ML-% IV SOLN
INTRAVENOUS | Status: DC | PRN
  Administered 2022-08-19: 60 ug/min via INTRAVENOUS

## 2022-08-19 MED ORDER — PRENATAL MULTIVITAMIN CH
1.0000 | ORAL_TABLET | Freq: Every day | ORAL | Status: DC
  Administered 2022-08-19 – 2022-08-21 (×3): 1 via ORAL
  Filled 2022-08-19 (×3): qty 1

## 2022-08-19 MED ORDER — NALOXONE HCL 4 MG/10ML IJ SOLN: 1.0000 ug/kg/h | INTRAVENOUS | Status: DC | PRN

## 2022-08-19 MED ORDER — DIPHENHYDRAMINE HCL 25 MG PO CAPS: 25.0000 mg | ORAL_CAPSULE | ORAL | Status: DC | PRN

## 2022-08-19 MED ORDER — FENTANYL CITRATE (PF) 100 MCG/2ML IJ SOLN
INTRAMUSCULAR | Status: DC | PRN
  Administered 2022-08-19: 15 ug via INTRATHECAL

## 2022-08-19 MED ORDER — SIMETHICONE 80 MG PO CHEW: 80.0000 mg | CHEWABLE_TABLET | ORAL | Status: DC | PRN

## 2022-08-19 MED ORDER — PHENYLEPHRINE 80 MCG/ML (10ML) SYRINGE FOR IV PUSH (FOR BLOOD PRESSURE SUPPORT)
PREFILLED_SYRINGE | INTRAVENOUS | Status: DC | PRN
  Administered 2022-08-19: 80 ug via INTRAVENOUS

## 2022-08-19 SURGICAL SUPPLY — 37 items
ADH SKN CLS APL DERMABOND .7 (GAUZE/BANDAGES/DRESSINGS) ×2
APL PRP STRL LF DISP 70% ISPRP (MISCELLANEOUS) ×2
CHLORAPREP W/TINT 26 (MISCELLANEOUS) ×4 IMPLANT
CLAMP UMBILICAL CORD (MISCELLANEOUS) ×2 IMPLANT
CLOTH BEACON ORANGE TIMEOUT ST (SAFETY) ×2 IMPLANT
DERMABOND ADVANCED .7 DNX12 (GAUZE/BANDAGES/DRESSINGS) ×4 IMPLANT
DRSG OPSITE POSTOP 4X10 (GAUZE/BANDAGES/DRESSINGS) ×2 IMPLANT
ELECT REM PT RETURN 9FT ADLT (ELECTROSURGICAL) ×1
ELECTRODE REM PT RTRN 9FT ADLT (ELECTROSURGICAL) ×2 IMPLANT
EXTRACTOR VACUUM BELL STYLE (SUCTIONS) IMPLANT
GAUZE PAD ABD 7.5X8 STRL (GAUZE/BANDAGES/DRESSINGS) IMPLANT
GAUZE SPONGE 4X4 12PLY STRL LF (GAUZE/BANDAGES/DRESSINGS) IMPLANT
GLOVE BIOGEL PI IND STRL 7.0 (GLOVE) ×2 IMPLANT
GLOVE BIOGEL PI IND STRL 8 (GLOVE) ×2 IMPLANT
GLOVE ECLIPSE 8.0 STRL XLNG CF (GLOVE) ×2 IMPLANT
GOWN STRL REUS W/TWL LRG LVL3 (GOWN DISPOSABLE) ×4 IMPLANT
KIT ABG SYR 3ML LUER SLIP (SYRINGE) ×2 IMPLANT
NDL HYPO 18GX1.5 BLUNT FILL (NEEDLE) ×2 IMPLANT
NDL HYPO 25X5/8 SAFETYGLIDE (NEEDLE) ×2 IMPLANT
NEEDLE HYPO 18GX1.5 BLUNT FILL (NEEDLE) ×1 IMPLANT
NEEDLE HYPO 22GX1.5 SAFETY (NEEDLE) ×2 IMPLANT
NEEDLE HYPO 25X5/8 SAFETYGLIDE (NEEDLE) ×1 IMPLANT
NS IRRIG 1000ML POUR BTL (IV SOLUTION) ×2 IMPLANT
PACK C SECTION WH (CUSTOM PROCEDURE TRAY) ×2 IMPLANT
PAD OB MATERNITY 4.3X12.25 (PERSONAL CARE ITEMS) ×2 IMPLANT
RTRCTR C-SECT PINK 25CM LRG (MISCELLANEOUS) IMPLANT
SUT CHROMIC 0 CT 1 (SUTURE) ×2 IMPLANT
SUT MNCRL 0 VIOLET CTX 36 (SUTURE) ×4 IMPLANT
SUT PLAIN 2 0 (SUTURE)
SUT PLAIN 2 0 XLH (SUTURE) IMPLANT
SUT PLAIN ABS 2-0 CT1 27XMFL (SUTURE) IMPLANT
SUT VIC AB 0 CTX 36 (SUTURE) ×1
SUT VIC AB 0 CTX36XBRD ANBCTRL (SUTURE) ×2 IMPLANT
SUT VIC AB 4-0 KS 27 (SUTURE) IMPLANT
TOWEL OR 17X24 6PK STRL BLUE (TOWEL DISPOSABLE) ×2 IMPLANT
TRAY FOLEY W/BAG SLVR 14FR LF (SET/KITS/TRAYS/PACK) IMPLANT
WATER STERILE IRR 1000ML POUR (IV SOLUTION) ×2 IMPLANT

## 2022-08-19 NOTE — Transfer of Care (Signed)
Immediate Anesthesia Transfer of Care Note  Patient: Navee Percy  Procedure(s) Performed: CESAREAN SECTION  Patient Location: PACU  Anesthesia Type:Spinal  Level of Consciousness: awake, alert , and oriented  Airway & Oxygen Therapy: Patient Spontanous Breathing  Post-op Assessment: Report given to RN and Post -op Vital signs reviewed and stable  Post vital signs: Reviewed and stable  Last Vitals:  Vitals Value Taken Time  BP 117/68 08/19/22 0835  Temp    Pulse 79 08/19/22 0839  Resp    SpO2 96 % 08/19/22 0839  Vitals shown include unvalidated device data.  Last Pain:  Vitals:   08/19/22 0617  TempSrc:   PainSc: 0-No pain         Complications: No notable events documented.

## 2022-08-19 NOTE — Discharge Summary (Signed)
Postpartum Discharge Summary  Date of Service updated***     Patient Name: Brittney Collins DOB: 29-Nov-1990 MRN: [id number]  Date of admission: 08/19/2022 Delivery date:  Delivering provider: Lazaro Arms  Date of discharge: 08/19/2022  Admitting diagnosis: S/P cesarean section [Z98.891] Intrauterine pregnancy: [redacted]w[redacted]d     Secondary diagnosis:  Principal Problem:   S/P cesarean section Active Problems:   Encounter for supervision of normal pregnancy, antepartum  Additional problems: ***    Discharge diagnosis: Term Pregnancy Delivered                                              Post partum procedures:{Postpartum procedures:23558} Augmentation: N/A Complications: None  Hospital course: Sceduled C/S   32 y.o. yo G2P1001 at [redacted]w[redacted]d was admitted to the hospital 08/19/2022 for scheduled cesarean section with the following indication:Malpresentation.Delivery details are as follows:  Membrane Rupture Time/Date:  ,   Delivery Method:C-Section, Low Transverse  Details of operation can be found in separate operative note.  Patient had a postpartum course complicated by***.  She is ambulating, tolerating a regular diet, passing flatus, and urinating well. Patient is discharged home in stable condition on  08/19/22        Newborn Data: Birth date:  Birth time:  Gender:  Living status:  Apgars: ,  Weight:     Magnesium Sulfate received: {Mag received:30440022} BMZ received: No Rhophylac:N/A MMR:N/A T-DaP:Given prenatally Flu: Yes Transfusion:{Transfusion received:30440034}  Physical exam  Vitals:   08/19/22 0550 08/19/22 0617  BP: (!) 139/97   Pulse: 91   Resp: 15   Temp: 98.4 F (36.9 C)   TempSrc: Oral   Weight:  73 kg  Height:  5\' 5"  (1.651 m)   General: {Exam; general:21111117} Lochia: {Desc; appropriate/inappropriate:30686::"appropriate"} Uterine Fundus: {Desc; firm/soft:30687} Incision: {Exam; incision:21111123} DVT Evaluation: {Exam; WUJ:8119147} Labs: Lab  Results  Component Value Date   WBC 11.2 (H) 08/18/2022   HGB 12.4 08/18/2022   HCT 37.3 08/18/2022   MCV 96.6 08/18/2022   PLT 216 08/18/2022      Latest Ref Rng & Units 09/10/2015    3:41 PM  CMP  Glucose 65 - 99 mg/dL 79   BUN 7 - 25 mg/dL 10   Creatinine 8.29 - 1.10 mg/dL 5.62   Sodium 130 - 865 mmol/L 139   Potassium 3.5 - 5.3 mmol/L 3.8   Chloride 98 - 110 mmol/L 104   CO2 20 - 31 mmol/L 24   Calcium 8.6 - 10.2 mg/dL 9.7   Total Protein 6.1 - 8.1 g/dL 7.1   Total Bilirubin 0.2 - 1.2 mg/dL 0.3   Alkaline Phos 33 - 115 U/L 40   AST 10 - 30 U/L 18   ALT 6 - 29 U/L 15    Edinburgh Score:    07/03/2017   10:54 AM  Edinburgh Postnatal Depression Scale Screening Tool  I have been able to laugh and see the funny side of things. 0  I have looked forward with enjoyment to things. 0  I have blamed myself unnecessarily when things went wrong. 0  I have been anxious or worried for no good reason. 0  I have felt scared or panicky for no good reason. 0  Things have been getting on top of me. 0  I have been so unhappy that I have had difficulty sleeping. 0  I have felt sad  or miserable. 0  I have been so unhappy that I have been crying. 0  The thought of harming myself has occurred to me. 0  Edinburgh Postnatal Depression Scale Total 0     After visit meds:  Allergies as of 08/19/2022   No Known Allergies   Med Rec must be completed prior to using this [facility]***        Discharge home in stable condition Infant Feeding: {Baby feeding:23562} Infant Disposition:{CHL IP OB HOME WITH WGNFAO:13086} Discharge instruction: per After Visit Summary and Postpartum booklet. Activity: Advance as tolerated. Pelvic rest for 6 weeks.  Diet: {OB VHQI:69629528} Future Appointments:No future appointments. Follow up Visit: Message sent to FT 7/6  Please schedule this patient for a In person postpartum visit in 6 weeks with the following provider: Any provider. Additional  Postpartum F/U:Incision check 1 week  Low risk pregnancy complicated by:  n/a Delivery mode:  C-Section, Low Transverse  Anticipated Birth Control:  POPs   08/19/2022 Myrtie Hawk, DO

## 2022-08-19 NOTE — Anesthesia Procedure Notes (Signed)
Spinal  Patient location during procedure: OB Start time: 08/19/2022 7:26 AM End time: 08/19/2022 7:30 AM Reason for block: surgical anesthesia Staffing Performed: anesthesiologist  Anesthesiologist: Trevor Iha, MD Performed by: Trevor Iha, MD Authorized by: Trevor Iha, MD   Preanesthetic Checklist Completed: patient identified, IV checked, risks and benefits discussed, surgical consent, monitors and equipment checked, pre-op evaluation and timeout performed Spinal Block Patient position: sitting Prep: DuraPrep and site prepped and draped Patient monitoring: heart rate, cardiac monitor, continuous pulse ox and blood pressure Approach: midline Location: L3-4 Injection technique: single-shot Needle Needle type: Pencan  Needle gauge: 24 G Needle length: 10 cm Needle insertion depth: 4 cm Assessment Sensory level: T4 Events: CSF return Additional Notes  1 Attempt (s). Pt tolerated procedure well.

## 2022-08-19 NOTE — Op Note (Signed)
Brittney Collins PROCEDURE DATE: 08/19/2022  PREOPERATIVE DIAGNOSES: Intrauterine pregnancy at [redacted]w[redacted]d weeks gestation; malpresentation: Breech  POSTOPERATIVE DIAGNOSES: The same, viable infant delivered  PROCEDURE: Primary Low Transverse Cesarean Section  SURGEON:  Dr. Turner Daniels  ASSISTANT:  Myrtie Hawk, DO An experienced assistant was required given the standard of surgical care given the complexity of the case.  This assistant was needed for exposure, dissection, suctioning, retraction, instrument exchange, assisting with delivery with administration of fundal pressure, and for overall help during the procedure.  ANESTHESIOLOGY TEAM: Anesthesiologist: Trevor Iha, MD CRNA: Sheppard Evens, CRNA  INDICATIONS: Brittney Collins is a 32 y.o. G2P1001 at [redacted]w[redacted]d here for cesarean section secondary to the indications listed under preoperative diagnoses; please see preoperative note for further details.  The risks of surgery were discussed with the patient including but were not limited to: bleeding which may require transfusion or reoperation; infection which may require antibiotics; injury to bowel, bladder, ureters or other surrounding organs; injury to the fetus; need for additional procedures including hysterectomy in the event of a life-threatening hemorrhage; formation of adhesions; placental abnormalities wth subsequent pregnancies; incisional problems; thromboembolic phenomenon and other postoperative/anesthesia complications.  The patient concurred with the proposed plan, giving informed written consent for the procedure.    FINDINGS:  Viable female infant in cephalic presentation.  Apgars 9 and 9.  Amniotic fluid: clear.  Intact placenta, three vessel cord.  Normal uterus, fallopian tubes and ovaries bilaterally.  ANESTHESIA: spinal INTRAVENOUS FLUIDS: 1000 ml   ESTIMATED BLOOD LOSS: 500 ml URINE OUTPUT:  50 ml SPECIMENS: Placenta sent to L&D ., R ovarian  cyst COMPLICATIONS: None immediate  PROCEDURE IN DETAIL:  The patient preoperatively received intravenous antibiotics and had sequential compression devices applied to her lower extremities.  She was then taken to the operating room where spinal anesthesia was found to be adequate. She was then placed in a dorsal supine position with a leftward tilt, and prepped and draped in a sterile manner.  A foley catheter was  placed into her bladder and attached to constant gravity.  After an adequate timeout was performed, a Pfannenstiel skin incision was made with scalpel and carried through to the underlying layer of fascia. The fascia was incised in the midline, and this incision was extended bluntly. The rectus muscles were separated in the midline and the peritoneum was entered bluntly.   The Alexis self-retaining retractor was introduced into the abdominal cavity.  Attention was turned to the lower uterine segment where a low transverse hysterotomy was made with a scalpel and extended bluntly in caudad and cephalad directions.  The infant was successfully delivered in breech position, the cord was clamped and cut after one minute, and the infant was handed over to the awaiting neonatology team. Uterine massage was then administered, and the placenta delivered intact with a three-vessel cord. The uterus was then exteriorized and cleared of clots and debris.  The hysterotomy was closed with 0-Monocryl in a running fashion.    Attention was turned to the right ovary where a cyst was located, approximately 5cm. Cyst was removed with scissors and base was ligated with 0 monocryl. Noted to be hemostatic.   The pelvis was cleared of all clot and debris. Hemostasis was confirmed on all surfaces. The uterus was returned to the abdomen and the incision was once again inspected and found to be hemostatic. The retractor was removed.  The peritoneum was closed with a 2-0 Chromic running stitch. The fascia was  then closed  using 0 Vicryl in a running fashion.  The subcutaneous layer was irrigated, any areas of bleeding were cauterized with the bovie,  was found to be hemostatic.. . The skin was closed with a 4-0 Vicryl subcuticular stitch. The patient tolerated the procedure well. Sponge, instrument and needle counts were correct x 3.  She was taken to the recovery room in stable condition.   Myrtie Hawk, DO FMOB Fellow, Faculty practice [facility], Center for [facility] 08/19/22  8:19 AM

## 2022-08-19 NOTE — H&P (Signed)
Preoperative History and Physical  Brittney Collins is a 32 y.o. G2P1001 with Patient's last menstrual period was 11/18/2021. admitted for a primary c section for persistent breech delcines ECV.  Hx of 3rd degree with previous delivery 3.6 cm right ovarian cyst  PMH:    Past Medical History:  Diagnosis Date   Medical history non-contributory     PSH:     Past Surgical History:  Procedure Laterality Date   WISDOM TOOTH EXTRACTION      POb/GynH:      OB History     Gravida  2   Para  1   Term  1   Preterm      AB      Living  1      SAB      IAB      Ectopic      Multiple      Live Births  1           SH:   Social History   Tobacco Use   Smoking status: Never   Smokeless tobacco: Never  Vaping Use   Vaping Use: Never used  Substance Use Topics   Alcohol use: No   Drug use: No    FH:    Family History  Problem Relation Age of Onset   Hyperlipidemia Mother    Breast cancer Mother 51   Hypertension Mother      Allergies: No Known Allergies  Medications:       Current Facility-Administered Medications:    ceFAZolin (ANCEF) IVPB 2g/100 mL premix, 2 g, Intravenous, On Call to OR, Dequandre Cordova, Amaryllis Dyke, MD   lactated ringers infusion, , Intravenous, Continuous, Lazaro Arms, MD, Last Rate: 125 mL/hr at 08/19/22 0646, New Bag at 08/19/22 0646   povidone-iodine 10 % swab 2 Application, 2 Application, Topical, Once, Lazaro Arms, MD  Review of Systems:   Review of Systems  Constitutional: Negative for fever, chills, weight loss, malaise/fatigue and diaphoresis.  HENT: Negative for hearing loss, ear pain, nosebleeds, congestion, sore throat, neck pain, tinnitus and ear discharge.   Eyes: Negative for blurred vision, double vision, photophobia, pain, discharge and redness.  Respiratory: Negative for cough, hemoptysis, sputum production, shortness of breath, wheezing and stridor.   Cardiovascular: Negative for chest pain, palpitations,  orthopnea, claudication, leg swelling and PND.  Gastrointestinal: Positive for abdominal pain. Negative for heartburn, nausea, vomiting, diarrhea, constipation, blood in stool and melena.  Genitourinary: Negative for dysuria, urgency, frequency, hematuria and flank pain.  Musculoskeletal: Negative for myalgias, back pain, joint pain and falls.  Skin: Negative for itching and rash.  Neurological: Negative for dizziness, tingling, tremors, sensory change, speech change, focal weakness, seizures, loss of consciousness, weakness and headaches.  Endo/Heme/Allergies: Negative for environmental allergies and polydipsia. Does not bruise/bleed easily.  Psychiatric/Behavioral: Negative for depression, suicidal ideas, hallucinations, memory loss and substance abuse. The patient is not nervous/anxious and does not have insomnia.      PHYSICAL EXAM:  Blood pressure (!) 139/97, pulse 91, temperature 98.4 F (36.9 C), temperature source Oral, resp. rate 15, height 5\' 5"  (1.651 m), weight 73 kg, last menstrual period 11/18/2021.    Vitals reviewed. Constitutional: She is oriented to person, place, and time. She appears well-developed and well-nourished.  HENT:  Head: Normocephalic and atraumatic.  Right Ear: External ear normal.  Left Ear: External ear normal.  Nose: Nose normal.  Mouth/Throat: Oropharynx is clear and moist.  Eyes: Conjunctivae and EOM are normal. Pupils  are equal, round, and reactive to light. Right eye exhibits no discharge. Left eye exhibits no discharge. No scleral icterus.  Neck: Normal range of motion. Neck supple. No tracheal deviation present. No thyromegaly present.  Cardiovascular: Normal rate, regular rhythm, normal heart sounds and intact distal pulses.  Exam reveals no gallop and no friction rub.   No murmur heard. Respiratory: Effort normal and breath sounds normal. No respiratory distress. She has no wheezes. She has no rales. She exhibits no tenderness.  GI: Soft. Bowel  sounds are normal. She exhibits no distension and no mass. There is tenderness. There is no rebound and no guarding.  Genitourinary:       Vulva is normal without lesions Vagina is pink moist without discharge Cervix normal in appearance and pap is normal Uterus is consistent with term Adnexa is negative with normal sized ovaries by sonogram  Musculoskeletal: Normal range of motion. She exhibits no edema and no tenderness.  Neurological: She is alert and oriented to person, place, and time. She has normal reflexes. She displays normal reflexes. No cranial nerve deficit. She exhibits normal muscle tone. Coordination normal.  Skin: Skin is warm and dry. No rash noted. No erythema. No pallor.  Psychiatric: She has a normal mood and affect. Her behavior is normal. Judgment and thought content normal.    Labs: Results for orders placed or performed during the hospital encounter of 08/18/22 (from the past 336 hour(s))  Type and screen   Collection Time: 08/18/22  9:13 AM  Result Value Ref Range   ABO/RH(D) A POS    Antibody Screen NEG    Sample Expiration      08/21/2022,2359 Performed at [facility], [address], [place], [place] [address]   CBC   Collection Time: 08/18/22  9:30 AM  Result Value Ref Range   WBC 11.2 (H) 4.0 - 10.5 K/uL   RBC 3.86 (L) 3.87 - 5.11 MIL/uL   Hemoglobin 12.4 12.0 - 15.0 g/dL   HCT 60.4 54.0 - 98.1 %   MCV 96.6 80.0 - 100.0 fL   MCH 32.1 26.0 - 34.0 pg   MCHC 33.2 30.0 - 36.0 g/dL   RDW 19.1 47.8 - 29.5 %   Platelets 216 150 - 400 K/uL   nRBC 0.0 0.0 - 0.2 %  Rapid HIV screen (HIV 1/2 Ab+Ag)   Collection Time: 08/18/22  9:30 AM  Result Value Ref Range   HIV-1 P24 Antigen - HIV24 NON REACTIVE NON REACTIVE   HIV 1/2 Antibodies NON REACTIVE NON REACTIVE   Interpretation (HIV Ag Ab)      A non reactive test result means that HIV 1 or HIV 2 antibodies and HIV 1 p24 antigen were not detected in the specimen.    EKG: No orders found for this or  any previous visit.  Imaging Studies: US OB Follow Up  Result Date: 08/01/2022 Table formatting from the original result was not included. Images from the original result were not included.  ..an Financial trader of Ultrasound Medicine Technical sales engineer) accredited practice Center for [facility] [address] [place] [address] Ordering Provider: Cheral Marker, CNM FOLLOW UP SONOGRAM Arliss Koep is in the office for a follow up sonogram for EFW . She is a 32 y.o. year old G2P1001 with Estimated Date of Delivery: 08/25/22 by LMP now at  [redacted]w[redacted]d weeks gestation. Thus far the pregnancy has been complicated by prior macrosomia. GESTATION: SINGLETON PRESENTATION: breech complete FETAL  ACTIVITY:          Heart rate         140          The fetus is active. AMNIOTIC FLUID: The amniotic fluid volume is  normal, 19.5 cm. PLACENTA LOCALIZATION:  Right lateral GRADE 2 CERVIX: Measures 3.7 cm GESTATIONAL AGE AND  BIOMETRICS: Gestational criteria: Estimated Date of Delivery: 08/25/22 by LMP now at [redacted]w[redacted]d Previous Scans:3          BIPARIETAL DIAMETER           9.52 cm         38+6 weeks HEAD CIRCUMFERENCE           34.14 cm         39+2 weeks ABDOMINAL CIRCUMFERENCE           35 cm         38+6 weeks FEMUR LENGTH           6.38 cm         33 weeks    .6%                                                       AVERAGE EGA(BY THIS SCAN):  36+4 weeks                                                 ESTIMATED FETAL WEIGHT:       3263  grams, 83 % ANATOMICAL SURVEY                                                                            COMMENTS CEREBRAL VENTRICLES yes normal  CHOROID PLEXUS yes normal  CEREBELLUM yes normal  CISTERNA MAGNA  Yes  normal   CAVUM SEPTI PELLUCIDI YES NORMAL                  FACIAL PROFILE yes normal  4 CHAMBERED HEART yes normal  OUTFLOW TRACTS YES normaL  3VV YES NORMAL  3VTV YES NORMAL  SITUS YES NORMAL      DIAPHRAGM yes normal  STOMACH yes normal  RENAL REGION yes  normal  BLADDER yes normal          3 VESSEL CORD yes normal              GENITALIA yes normal female     SUSPECTED ABNORMALITIES:  yes QUALITY OF SCAN: satisfactory TECHNICIAN COMMENTS: [name] 36+3 wks,complete breech,right lateral placenta gr 2,CX 3.7 cm,AFI 19 cm,EFW 3263 g 82%,FL .6%,FHR 140 bpm A copy of this report including all images has been saved and backed up to a second source for retrieval if needed. All measures and details of the anatomical scan, placentation, fluid volume and pelvic anatomy are contained in that report. Karie Chimera 07/31/2022 4:42 PM Clinical Impression and recommendations: I have reviewed the sonogram results above, combined  with the patient's current clinical course, below are my impressions and any appropriate recommendations for management based on the sonographic findings. 1.  G2P1001 Estimated Date of Delivery: 08/25/22 by serial sonographic evaluations 2.  Fetal sonographic surveillance findings: a). Normal fluid volume c). Short femur length noted at <1%, however, normal growth percentile with appropriate interval growth:  82% 3.  Normal general sonographic findings Recommend continued prenatal evaluations and care based on this sonogram and as clinically indicated from the patient's clinical course. Myna Hidalgo, DO Attending Obstetrician & Gynecologist, [facility] for [facility], [facility]       Assessment: [redacted]w[redacted]d Estimated Date of Delivery: 08/25/22  Breech, declines ECV Hx of 3rd degree with previous vaginal delivery 3.6 cm right ovarian cyst  Plan: Primary Caesarean section  Lazaro Arms 08/19/2022 7:07 AM

## 2022-08-19 NOTE — Anesthesia Postprocedure Evaluation (Signed)
Anesthesia Post Note  Patient: Brittney Collins  Procedure(s) Performed: CESAREAN SECTION     Patient location during evaluation: Mother Baby Anesthesia Type: Spinal Level of consciousness: oriented and awake and alert Pain management: pain level controlled Vital Signs Assessment: post-procedure vital signs reviewed and stable Respiratory status: spontaneous breathing and respiratory function stable Cardiovascular status: blood pressure returned to baseline and stable Postop Assessment: no headache, no backache, no apparent nausea or vomiting and able to ambulate Anesthetic complications: no   No notable events documented.  Last Vitals:  Vitals:   08/19/22 0915 08/19/22 0930  BP: 112/60 110/66  Pulse: 83 99  Resp: 17 20  Temp:    SpO2: 98% 98%    Last Pain:  Vitals:   08/19/22 0930  TempSrc:   PainSc: 0-No pain   Pain Goal:    LLE Motor Response: Purposeful movement (08/19/22 0915) LLE Sensation: Tingling (08/19/22 0915) RLE Motor Response: Purposeful movement (08/19/22 0915) RLE Sensation: Tingling (08/19/22 0915)     Epidural/Spinal Function Cutaneous sensation: Able to Wiggle Toes (08/19/22 0930), Patient able to flex knees: No (08/19/22 0930), Patient able to lift hips off bed: No (08/19/22 0930), Back pain beyond tenderness at insertion site: No (08/19/22 0930), Progressively worsening motor and/or sensory loss: No (08/19/22 0930), Bowel and/or bladder incontinence post epidural: No (08/19/22 0930)  Trevor Iha

## 2022-08-19 NOTE — Lactation Note (Signed)
This note was copied from a baby's chart. Lactation Consultation Note  Patient Name: [name] [id number] Date: 08/19/2022 Age:32 hours Reason for consult: Initial assessment;Term  P2, 39.1 GA, primary cesarean delivery  Upon arrival to room, mother had baby in a cross cradle hold while she was reclined in bed post c/s and baby was latched to the breast. She reports, he was fussy and she latched him. He was mostly sleeping but would suckle in bursts with stimulation. RN reports baby has fed well in previous feedings.   This is mother's first time breastfeeding. She has another child that she formula fed by choice. She recalls her milk came in, breast were firm and she leaked milk a few days after delivery. Mother reports positive breast changes with this pregnancy. Mother voices "concerns about breastfeeding being enough for her baby?" Taught mother hand expression and colostrum observed. Encouraged mother to breast feed with cues, skin to skin if not latching (baby has already latched several times since birth) and call for assistance with breastfeeding, as needed.  Mom made aware of O/P services, breastfeeding support groups, community resources, and our phone # for post-discharge questions.     Maternal Data Has patient been taught Hand Expression?: Yes Does the patient have breastfeeding experience prior to this delivery?: No (2nd baby, did not breast feed her first)  Feeding Mother's Current Feeding Choice: Breast Milk   Interventions Interventions: Breast feeding basics reviewed;Education;LC Services brochure  Discharge Pump: DEBP;Personal  Consult Status Consult Status: Follow-up Date: 08/20/22 Follow-up type: In-patient    Christella Hartigan M 08/19/2022, 4:14 PM

## 2022-08-20 LAB — CBC
HCT: 31.2 % — ABNORMAL LOW (ref 36.0–46.0)
Hemoglobin: 10.3 g/dL — ABNORMAL LOW (ref 12.0–15.0)
MCH: 31.8 pg (ref 26.0–34.0)
MCHC: 33 g/dL (ref 30.0–36.0)
MCV: 96.3 fL (ref 80.0–100.0)
Platelets: 166 10*3/uL (ref 150–400)
RBC: 3.24 MIL/uL — ABNORMAL LOW (ref 3.87–5.11)
RDW: 14.2 % (ref 11.5–15.5)
WBC: 9.2 10*3/uL (ref 4.0–10.5)
nRBC: 0 % (ref 0.0–0.2)

## 2022-08-20 MED ORDER — IBUPROFEN 100 MG/5ML PO SUSP
600.0000 mg | Freq: Four times a day (QID) | ORAL | Status: DC
  Administered 2022-08-20 – 2022-08-21 (×4): 600 mg via ORAL
  Filled 2022-08-20 (×6): qty 30

## 2022-08-20 MED ORDER — GABAPENTIN 250 MG/5ML PO SOLN
200.0000 mg | Freq: Every day | ORAL | Status: DC
  Administered 2022-08-20: 200 mg via ORAL
  Filled 2022-08-20 (×3): qty 4

## 2022-08-20 MED ORDER — ACETAMINOPHEN 160 MG/5ML PO SOLN
1000.0000 mg | Freq: Four times a day (QID) | ORAL | Status: DC
  Administered 2022-08-20 – 2022-08-21 (×5): 1000 mg via ORAL
  Filled 2022-08-20 (×5): qty 40.6

## 2022-08-20 NOTE — Progress Notes (Signed)
POSTPARTUM PROGRESS NOTE  Subjective: Brittney Collins is a 32 y.o. G2P2002 s/p LTCS, breech at [redacted]w[redacted]d.  She reports she doing well. No acute events overnight. She denies any problems with ambulating, voiding or po intake. Denies nausea or vomiting. Pain is well controlled. Minimal vaginal bleeding.   Objective: Blood pressure (!) 98/59, pulse (!) 54, temperature (!) 97.5 F (36.4 C), temperature source Oral, resp. rate 18, height 5\' 5"  (1.651 m), weight 73 kg, last menstrual period 11/18/2021, SpO2 98 %, unknown if currently breastfeeding.  Physical Exam:  General: alert, cooperative and no distress Chest: no respiratory distress Abdomen: soft, non-tender  Uterine Fundus: firm and at level of umbilicus Extremities: No calf swelling or tenderness  No LE edema C-section incision intact, minimal bleeding around site, no discharge  Recent Labs    08/18/22 0930 08/20/22 0517  HGB 12.4 10.3*  HCT 37.3 31.2*    Assessment/Plan: Brittney Collins is a 32 y.o. [id number] s/p LTCS at [redacted]w[redacted]d for breech.  Routine Postpartum Care: Doing well, pain well-controlled.  -- Continue routine care, lactation support  -- Contraception: POPs -- Feeding: breast  Dispo: Plan for discharge tomorrow  Para March, DO Faculty Practice, Center for [employer] 08/20/2022 7:32 AM

## 2022-08-21 ENCOUNTER — Encounter: Payer: BC Managed Care – PPO | Admitting: Women's Health

## 2022-08-21 MED ORDER — OXYCODONE HCL 5 MG PO TABS: 5.0000 mg | ORAL_TABLET | ORAL | 0 refills | Status: DC | PRN

## 2022-08-21 MED ORDER — IBUPROFEN 100 MG/5ML PO SUSP: 600.0000 mg | Freq: Four times a day (QID) | ORAL | 0 refills | Status: DC

## 2022-08-21 MED ORDER — SENNOSIDES-DOCUSATE SODIUM 8.6-50 MG PO TABS: 2.0000 | ORAL_TABLET | Freq: Every day | ORAL | 0 refills | Status: DC

## 2022-08-21 MED ORDER — OXYCODONE-ACETAMINOPHEN 5-325 MG PO TABS: 1.0000 | ORAL_TABLET | Freq: Four times a day (QID) | ORAL | 0 refills | Status: DC | PRN

## 2022-08-21 NOTE — Progress Notes (Addendum)
POSTPARTUM PROGRESS NOTE  Subjective: Brittney Collins is a 32 y.o. [id number] s/p LTCS breech at [redacted]w[redacted]d.  She reports she doing well. No acute events overnight. She denies any problems with ambulating, voiding or po intake. Denies nausea or vomiting. Pain is well controlled.  Objective: Blood pressure 108/68, pulse 81, temperature 98.2 F (36.8 C), temperature source Oral, resp. rate 18, height 5\' 5"  (1.651 m), weight 73 kg, last menstrual period 11/18/2021, SpO2 99 %, unknown if currently breastfeeding.  Physical Exam:  General: alert, cooperative and no distress Chest: no respiratory distress Abdomen: soft, non-tender  Uterine Fundus: firm and at level of umbilicus Extremities: No calf swelling or tenderness  No LE edema - CS incision intact, healing  Recent Labs    08/18/22 0930 08/20/22 0517  HGB 12.4 10.3*  HCT 37.3 31.2*    Assessment/Plan: Brittney Collins is a 32 y.o. G2P2002 s/p LTCS at [redacted]w[redacted]d for breech.  Routine Postpartum Care: Doing well, pain well-controlled.  -- Continue routine care, lactation support  -- Contraception: POPs -- Feeding: breast   Dispo: Plan for discharge tomorrow.  Para March, DO Faculty Practice, Center for [employer] 08/21/2022 8:07 AM

## 2022-08-23 LAB — SURGICAL PATHOLOGY

## 2022-08-29 ENCOUNTER — Encounter: Payer: Self-pay | Admitting: Obstetrics & Gynecology

## 2022-08-29 ENCOUNTER — Ambulatory Visit (INDEPENDENT_AMBULATORY_CARE_PROVIDER_SITE_OTHER): Payer: BC Managed Care – PPO | Admitting: Obstetrics & Gynecology

## 2022-08-29 VITALS — BP 172/91 | HR 60 | Ht 65.0 in | Wt 143.2 lb

## 2022-08-29 DIAGNOSIS — Z4889 Encounter for other specified surgical aftercare: Secondary | ICD-10-CM | POA: Diagnosis not present

## 2022-08-29 DIAGNOSIS — O165 Unspecified maternal hypertension, complicating the puerperium: Secondary | ICD-10-CM | POA: Diagnosis not present

## 2022-08-29 MED ORDER — NIFEDIPINE ER OSMOTIC RELEASE 30 MG PO TB24: 30.0000 mg | ORAL_TABLET | Freq: Every day | ORAL | 11 refills | Status: DC

## 2022-08-29 NOTE — Progress Notes (Signed)
    PostOp Visit Note  Brittney Collins is a 32 y.o. G12P2002 female who presents for a postoperative visit. She is 1 weeks postop following a primary C-section due to breech presentation completed on 7/6   Today she notes no acute complaints Denies fever or chills.  Tolerating gen diet.  +Flatus, Regular BMs.  Pain is well controlled.  Denies headache, blurry vision, no RUQ pain. Overall doing well and reports no acute complaints   Review of Systems Pertinent items are noted in HPI.    Objective:  BP (!) 162/96 (BP Location: Right Arm, Patient Position: Sitting, Cuff Size: Normal)   Pulse 60   Ht 5\' 5"  (1.651 m)   Wt 143 lb 3.2 oz (65 kg)   LMP 11/18/2021   Breastfeeding Yes   BMI 23.83 kg/m    Physical Examination:  GENERAL ASSESSMENT: well developed and well nourished SKIN: warm and dry CHEST: normal air exchange, respiratory effort normal with no retractions HEART: regular rate and rhythm ABDOMEN: soft, non-distended, no rebound, no guarding INCISION: well healed, clean/dry/intact EXTREMITY: no edema, no calf tenderness bilaterally PSYCH: mood appropriate, normal affect       Assessment:    Postop incision  Elevated blood pressure Plan:   -Elevated blood pressure  Pt asymptomatic and has not been checking BPs at home  Baseline labs today  She notes being rushed to today's visit, will repeat BP check at home this afternoon  []  BP check tomorrow for further evaluation  -incision well healed, meeting postop milestones appropriately  Myna Hidalgo, DO Attending Obstetrician & Gynecologist, Faculty Practice Center for [employer], [facility]

## 2022-08-29 NOTE — Addendum Note (Signed)
Addended by: Sharon Seller on: 08/29/2022 03:05 PM   Modules accepted: Orders

## 2022-08-30 ENCOUNTER — Telehealth: Payer: BC Managed Care – PPO

## 2022-08-30 LAB — PROTEIN / CREATININE RATIO, URINE
Creatinine, Urine: 32.7 mg/dL
Protein, Ur: 6.2 mg/dL
Protein/Creat Ratio: 190 mg/g creat (ref 0–200)

## 2022-08-30 LAB — COMPREHENSIVE METABOLIC PANEL
ALT: 35 IU/L — ABNORMAL HIGH (ref 0–32)
AST: 22 IU/L (ref 0–40)
Albumin: 4.3 g/dL (ref 3.9–4.9)
Alkaline Phosphatase: 95 IU/L (ref 44–121)
BUN/Creatinine Ratio: 20 (ref 9–23)
BUN: 22 mg/dL — ABNORMAL HIGH (ref 6–20)
Bilirubin Total: <0.2 mg/dL (ref 0.0–1.2)
CO2: 23 mmol/L (ref 20–29)
Calcium: 10.1 mg/dL (ref 8.7–10.2)
Chloride: 103 mmol/L (ref 96–106)
Creatinine, Ser: 1.1 mg/dL — ABNORMAL HIGH (ref 0.57–1.00)
Globulin, Total: 2.6 g/dL (ref 1.5–4.5)
Glucose: 79 mg/dL (ref 70–99)
Potassium: 4.7 mmol/L (ref 3.5–5.2)
Sodium: 144 mmol/L (ref 134–144)
Total Protein: 6.9 g/dL (ref 6.0–8.5)
eGFR: 69 mL/min/{1.73_m2} (ref >59)

## 2022-08-30 LAB — CBC
Hematocrit: 33.9 % — ABNORMAL LOW (ref 34.0–46.6)
Hemoglobin: 11.7 g/dL (ref 11.1–15.9)
MCH: 32.9 pg (ref 26.6–33.0)
MCHC: 34.5 g/dL (ref 31.5–35.7)
MCV: 95 fL (ref 79–97)
Platelets: 388 10*3/uL (ref 150–450)
RBC: 3.56 x10E6/uL — ABNORMAL LOW (ref 3.77–5.28)
RDW: 13.1 % (ref 11.7–15.4)
WBC: 8.4 10*3/uL (ref 3.4–10.8)

## 2022-09-01 ENCOUNTER — Ambulatory Visit (INDEPENDENT_AMBULATORY_CARE_PROVIDER_SITE_OTHER): Payer: BC Managed Care – PPO | Admitting: *Deleted

## 2022-09-01 VITALS — BP 143/90 | HR 67

## 2022-09-01 DIAGNOSIS — Z0130 Encounter for examination of blood pressure without abnormal findings: Secondary | ICD-10-CM

## 2022-09-01 NOTE — Progress Notes (Signed)
   NURSE VISIT- BLOOD PRESSURE CHECK  SUBJECTIVE:  Brittney Collins is a 32 y.o. [phone] female here for BP check. She is postpartum, delivery date 08/19/22     HYPERTENSION ROS:  Pregnant/postpartum:  Severe headaches that don't go away with tylenol/other medicines: No  Visual changes (seeing spots/double/blurred vision) No  Severe pain under right breast breast or in center of upper chest No  Severe nausea/vomiting No  Taking medicines as instructed yes    OBJECTIVE:  BP (!) 143/90   Pulse 67   LMP 11/18/2021   Breastfeeding Yes   Appearance alert, well appearing, and in no distress.  ASSESSMENT: Postpartum  blood pressure check  PLAN: Discussed with Dr. Despina Hidden   Recommendations: no changes needed   Follow-up: as scheduled   Annamarie Dawley  09/01/2022 11:49 AM

## 2022-10-03 ENCOUNTER — Encounter: Payer: Self-pay | Admitting: Women's Health

## 2022-10-03 ENCOUNTER — Ambulatory Visit (INDEPENDENT_AMBULATORY_CARE_PROVIDER_SITE_OTHER): Payer: BC Managed Care – PPO | Admitting: Women's Health

## 2022-10-03 DIAGNOSIS — Z98891 History of uterine scar from previous surgery: Secondary | ICD-10-CM

## 2022-10-03 DIAGNOSIS — R7989 Other specified abnormal findings of blood chemistry: Secondary | ICD-10-CM | POA: Diagnosis not present

## 2022-10-03 DIAGNOSIS — N83201 Unspecified ovarian cyst, right side: Secondary | ICD-10-CM

## 2022-10-03 DIAGNOSIS — Z8679 Personal history of other diseases of the circulatory system: Secondary | ICD-10-CM | POA: Diagnosis not present

## 2022-10-03 DIAGNOSIS — R7401 Elevation of levels of liver transaminase levels: Secondary | ICD-10-CM

## 2022-10-03 DIAGNOSIS — Z8759 Personal history of other complications of pregnancy, childbirth and the puerperium: Secondary | ICD-10-CM

## 2022-10-03 DIAGNOSIS — Z30011 Encounter for initial prescription of contraceptive pills: Secondary | ICD-10-CM

## 2022-10-03 MED ORDER — LO LOESTRIN FE 1 MG-10 MCG / 10 MCG PO TABS: 1.0000 | ORAL_TABLET | Freq: Every day | ORAL | 3 refills | Status: DC

## 2022-10-03 NOTE — Progress Notes (Signed)
POSTPARTUM VISIT Patient name: Brittney Collins MRN [id number]  Date of birth: 30-Dec-1990 Chief Complaint:   Postpartum Care  History of Present Illness:   Brittney Collins is a 32 y.o. G61P2002 Caucasian female being seen today for a postpartum visit. She is 6 weeks postpartum following a primary cesarean section, low transverse incision at 39.1 gestational weeks d/t breech. IOL: no, for n/a. Anesthesia: spinal.  Laceration: n/a.  Complications: none. Inpatient contraception: no.   Pregnancy uncomplicated. Tobacco use: no. Substance use disorder: no. Last pap smear: 02/20/22 and results were NILM w/ HRHPV negative. Next pap smear due: 2027 Patient's last menstrual period was 10/02/2022 (exact date).  Postpartum course has been complicated by PPHTN at 1wk incision check, Cr & ALT elevated, rx'd nifedipine, stopped a few weeks ago . Bleeding  on period . Bowel function is normal. Bladder function is normal. Urinary incontinence? no, fecal incontinence? no Patient is not sexually active. Last sexual activity: prior to birth of baby. Desired contraception: COCs. Does not smoke, no h/o HTN, DVT/PE, CVA, MI, or migraines w/ aura.  Patient does want a pregnancy in the future.  Desired family size is 3 children.   Upstream - 10/03/22 1116       Pregnancy Intention Screening   Does the patient want to become pregnant in the next year? No    Does the patient's partner want to become pregnant in the next year? No    Would the patient like to discuss contraceptive options today? Yes      Contraception Wrap Up   Current Method Abstinence    End Method Oral Contraceptive            The pregnancy intention screening data noted above was reviewed. Potential methods of contraception were discussed. The patient elected to proceed with Oral Contraceptive.  Edinburgh Postpartum Depression Screening: negative  Edinburgh Postnatal Depression Scale - 10/03/22 1117       Edinburgh Postnatal  Depression Scale:  In the Past 7 Days   I have been able to laugh and see the funny side of things. 0    I have looked forward with enjoyment to things. 0    I have blamed myself unnecessarily when things went wrong. 0    I have been anxious or worried for no good reason. 0    I have felt scared or panicky for no good reason. 0    Things have been getting on top of me. 0    I have been so unhappy that I have had difficulty sleeping. 0    I have felt sad or miserable. 0    I have been so unhappy that I have been crying. 0    The thought of harming myself has occurred to me. 0    Edinburgh Postnatal Depression Scale Total 0                02/20/2022    9:56 AM  GAD 7 : Generalized Anxiety Score  Nervous, Anxious, on Edge 0  Control/stop worrying 0  Worry too much - different things 0  Trouble relaxing 0  Restless 0  Easily annoyed or irritable 0  Afraid - awful might happen 0  Total GAD 7 Score 0     Baby's course has been uncomplicated. Baby is feeding by bottle. Infant has a pediatrician/family doctor? Yes.  Childcare strategy if returning to work/school: family.  Pt has material needs met for her and baby: Yes.  Review of Systems:   Pertinent items are noted in HPI Denies Abnormal vaginal discharge w/ itching/odor/irritation, headaches, visual changes, shortness of breath, chest pain, abdominal pain, severe nausea/vomiting, or problems with urination or bowel movements. Pertinent History Reviewed:  Reviewed past medical,surgical, obstetrical and family history.  Reviewed problem list, medications and allergies. OB History  Gravida Para Term Preterm AB Living  2 2 2     2   SAB IAB Ectopic Multiple Live Births        0 2    # Outcome Date GA Lbr Len/2nd Weight Sex Type Anes PTL Lv  2 Term 08/19/22 [redacted]w[redacted]d  7 lb 11.5 oz (3.5 kg) M CS-LTranv Spinal  LIV  1 Term 05/26/17 110w0d  9 lb 2 oz (4.139 kg) M Vag-Spont None N LIV   Physical Assessment:   Vitals:   10/03/22 1114   BP: 137/79  Pulse: 66  Weight: 139 lb 6.4 oz (63.2 kg)  Height: 5\' 5"  (1.651 m)  Body mass index is 23.2 kg/m.       Physical Examination:   General appearance: alert, well appearing, and in no distress  Mental status: alert, oriented to person, place, and time  Skin: warm & dry   Cardiovascular: normal heart rate noted   Respiratory: normal respiratory effort, no distress   Breasts: deferred, no complaints   Abdomen: soft, non-tender, c/s incision well healed  Pelvic: examination not indicated. Thin prep pap obtained: No  Rectal: not examined  Extremities: Edema: none   Chaperone: N/A         No results found for this or any previous visit (from the past 24 hour(s)).  Assessment & Plan:  1) Postpartum exam 2) 6 wks s/p primary cesarean section, low transverse incision d/t breech 3) bottle feeding 4) Depression screening 5) Contraception management: rx LoLo, condoms x 2wks, f/u 6) Resolved PPHTN 7) Elevated Cr & ALT> repeat today 8) Rt ovarian cyst during pregnancy> needs f/u gyn u/s, ordered, note routed to [facility] to schedule  Essential components of care per ACOG recommendations:  1.  Mood and well being:  If positive depression screen, discussed and plan developed.  If using tobacco we discussed reduction/cessation and risk of relapse If current substance abuse, we discussed and referral to local resources was offered.   2. Infant care and feeding:  If breastfeeding, discussed returning to work, pumping, breastfeeding-associated pain, guidance regarding return to fertility while lactating if not using another method. If needed, patient was provided with a letter to be allowed to pump q 2-3hrs to support lactation in a private location with access to a refrigerator to store breastmilk.   Recommended that all caregivers be immunized for flu, pertussis and other preventable communicable diseases If pt does not have material needs met for her/baby, referred to local  resources for help obtaining these.  3. Sexuality, contraception and birth spacing Provided guidance regarding sexuality, management of dyspareunia, and resumption of intercourse Discussed avoiding interpregnancy interval <49mths and recommended birth spacing of 18 months  4. Sleep and fatigue Discussed coping options for fatigue and sleep disruption Encouraged family/partner/community support of 4 hrs of uninterrupted sleep to help with mood and fatigue  5. Physical recovery  If pt had a C/S, assessed incisional pain and providing guidance on normal vs prolonged recovery If pt had a laceration, perineal healing and pain reviewed.  If urinary or fecal incontinence, discussed management and referred to PT or uro/gyn if indicated  Patient is safe to resume  physical activity. Discussed attainment of healthy weight.  6.  Chronic disease management Discussed pregnancy complications if any, and their implications for future childbearing and long-term maternal health. Review recommendations for prevention of recurrent pregnancy complications, such as 17 hydroxyprogesterone caproate to reduce risk for recurrent PTB not applicable, or aspirin to reduce risk of preeclampsia yes. Pt had GDM: no. If yes, 2hr GTT scheduled: not applicable. Reviewed medications and non-pregnant dosing including consideration of whether pt is breastfeeding using a reliable resource such as LactMed: not applicable Referred for f/u w/ PCP or subspecialist providers as indicated: not applicable  7. Health maintenance Mammogram at 32yo or earlier if indicated Pap smears as indicated  Meds:  Meds ordered this encounter  Medications   LO LOESTRIN FE 1 MG-10 MCG / 10 MCG tablet    Sig: Take 1 tablet by mouth daily.    Dispense:  90 tablet    Refill:  3    For co-pay card, pt to text "Lo Loestrin Fe " to [phone]              Co-pay card must be run in second position  "other coverage code 3"  if denied d/t PA, step edit, or  insurance denial    Follow-up: Return in about 3 months (around 01/03/2023) for med f/u, CNM, in person.   Orders Placed This Encounter  Procedures   US PELVIC COMPLETE WITH TRANSVAGINAL   Comprehensive metabolic panel    Cheral Marker CNM, [facility] 10/03/2022 11:37 AM

## 2022-10-09 ENCOUNTER — Encounter: Payer: Self-pay | Admitting: Women's Health

## 2022-10-20 DIAGNOSIS — R7401 Elevation of levels of liver transaminase levels: Secondary | ICD-10-CM | POA: Diagnosis not present

## 2022-10-20 DIAGNOSIS — R7989 Other specified abnormal findings of blood chemistry: Secondary | ICD-10-CM | POA: Diagnosis not present

## 2022-10-21 LAB — COMPREHENSIVE METABOLIC PANEL
ALT: 45 IU/L — ABNORMAL HIGH (ref 0–32)
AST: 26 IU/L (ref 0–40)
Albumin: 4.6 g/dL (ref 3.9–4.9)
Alkaline Phosphatase: 48 IU/L (ref 44–121)
BUN/Creatinine Ratio: 23 (ref 9–23)
BUN: 18 mg/dL (ref 6–20)
Bilirubin Total: 0.6 mg/dL (ref 0.0–1.2)
CO2: 21 mmol/L (ref 20–29)
Calcium: 9.7 mg/dL (ref 8.7–10.2)
Chloride: 104 mmol/L (ref 96–106)
Creatinine, Ser: 0.79 mg/dL (ref 0.57–1.00)
Globulin, Total: 2.3 g/dL (ref 1.5–4.5)
Glucose: 97 mg/dL (ref 70–99)
Potassium: 4.2 mmol/L (ref 3.5–5.2)
Sodium: 140 mmol/L (ref 134–144)
Total Protein: 6.9 g/dL (ref 6.0–8.5)
eGFR: 102 mL/min/{1.73_m2} (ref >59)

## 2022-10-31 ENCOUNTER — Encounter: Payer: Self-pay | Admitting: Women's Health

## 2022-12-26 DIAGNOSIS — Z6822 Body mass index (BMI) 22.0-22.9, adult: Secondary | ICD-10-CM | POA: Diagnosis not present

## 2022-12-26 DIAGNOSIS — F411 Generalized anxiety disorder: Secondary | ICD-10-CM | POA: Diagnosis not present

## 2023-07-05 DIAGNOSIS — L700 Acne vulgaris: Secondary | ICD-10-CM | POA: Diagnosis not present

## 2023-08-16 DIAGNOSIS — L700 Acne vulgaris: Secondary | ICD-10-CM | POA: Diagnosis not present

## 2024-01-17 DIAGNOSIS — L700 Acne vulgaris: Secondary | ICD-10-CM | POA: Diagnosis not present

## 2024-01-18 ENCOUNTER — Ambulatory Visit

## 2024-01-18 VITALS — BP 132/77 | HR 81 | Ht 66.0 in | Wt 131.5 lb

## 2024-01-18 DIAGNOSIS — Z3201 Encounter for pregnancy test, result positive: Secondary | ICD-10-CM | POA: Diagnosis not present

## 2024-01-18 LAB — POCT URINE PREGNANCY: Preg Test, Ur: POSITIVE — AB

## 2024-01-18 NOTE — Progress Notes (Signed)
   NURSE VISIT- PREGNANCY CONFIRMATION   SUBJECTIVE:  Kendra Grissett is a 33 y.o. G66P2002 female at [redacted]w[redacted]d by certain LMP of Patient's last menstrual period was 12/14/2023 (exact date). Here for pregnancy confirmation.  Home pregnancy test: positive x 2  She reports nausea.  She is taking prenatal vitamins.    OBJECTIVE:  LMP 12/14/2023 (Exact Date)   Breastfeeding No   Appears well, in no apparent distress  No results found for this or any previous visit (from the past 24 hours).  ASSESSMENT: Positive pregnancy test, [redacted]w[redacted]d by LMP    PLAN: Schedule for dating ultrasound in 3 weeks Prenatal vitamins: plans to begin OTC ASAP   Nausea medicines: not currently needed   OB packet given: Yes  Aleck FORBES Blase  01/18/2024 10:51 AM

## 2024-02-06 ENCOUNTER — Encounter: Payer: Self-pay | Admitting: Advanced Practice Midwife

## 2024-02-11 ENCOUNTER — Other Ambulatory Visit: Payer: Self-pay | Admitting: Obstetrics & Gynecology

## 2024-02-11 DIAGNOSIS — O3680X0 Pregnancy with inconclusive fetal viability, not applicable or unspecified: Secondary | ICD-10-CM

## 2024-02-12 ENCOUNTER — Ambulatory Visit

## 2024-02-12 DIAGNOSIS — O3680X0 Pregnancy with inconclusive fetal viability, not applicable or unspecified: Secondary | ICD-10-CM

## 2024-02-12 NOTE — Progress Notes (Signed)
 US  8+4 wks,single IUP with yolk sac,FHR 178 bpm,normal ovaries,CRL 21.37 mm,subchorionic hemorrhage right 2.6 x1 x 1.4,SCH left 2.3 x2 x 1.9 cm,anterior fundal subserosal fibroid 1.2 x .9 x 1.5 cm

## 2024-03-05 ENCOUNTER — Ambulatory Visit: Admitting: Nurse Practitioner

## 2024-03-10 ENCOUNTER — Encounter: Admitting: Women's Health

## 2024-03-10 ENCOUNTER — Encounter: Payer: Self-pay | Admitting: *Deleted

## 2024-03-11 ENCOUNTER — Ambulatory Visit: Admitting: Women's Health

## 2024-03-11 ENCOUNTER — Other Ambulatory Visit (HOSPITAL_COMMUNITY)
Admission: RE | Admit: 2024-03-11 | Discharge: 2024-03-11 | Disposition: A | Discharge status: Home / Self Care | Source: Ambulatory Visit | Attending: Women's Health | Admitting: Women's Health

## 2024-03-11 ENCOUNTER — Encounter: Payer: Self-pay | Admitting: Women's Health

## 2024-03-11 ENCOUNTER — Encounter: Admitting: *Deleted

## 2024-03-11 VITALS — BP 121/81 | HR 72 | Wt 135.0 lb

## 2024-03-11 DIAGNOSIS — Z113 Encounter for screening for infections with a predominantly sexual mode of transmission: Secondary | ICD-10-CM | POA: Diagnosis present

## 2024-03-11 DIAGNOSIS — Z98891 History of uterine scar from previous surgery: Secondary | ICD-10-CM

## 2024-03-11 DIAGNOSIS — Z3481 Encounter for supervision of other normal pregnancy, first trimester: Secondary | ICD-10-CM

## 2024-03-11 DIAGNOSIS — Z8759 Personal history of other complications of pregnancy, childbirth and the puerperium: Secondary | ICD-10-CM | POA: Insufficient documentation

## 2024-03-11 DIAGNOSIS — Z131 Encounter for screening for diabetes mellitus: Secondary | ICD-10-CM

## 2024-03-11 DIAGNOSIS — Z3480 Encounter for supervision of other normal pregnancy, unspecified trimester: Secondary | ICD-10-CM | POA: Insufficient documentation

## 2024-03-11 DIAGNOSIS — Z3490 Encounter for supervision of normal pregnancy, unspecified, unspecified trimester: Secondary | ICD-10-CM | POA: Insufficient documentation

## 2024-03-11 DIAGNOSIS — Z8679 Personal history of other diseases of the circulatory system: Secondary | ICD-10-CM | POA: Diagnosis not present

## 2024-03-11 DIAGNOSIS — O34211 Maternal care for low transverse scar from previous cesarean delivery: Secondary | ICD-10-CM

## 2024-03-11 DIAGNOSIS — Z1332 Encounter for screening for maternal depression: Secondary | ICD-10-CM | POA: Diagnosis not present

## 2024-03-11 DIAGNOSIS — Z3A12 12 weeks gestation of pregnancy: Secondary | ICD-10-CM | POA: Insufficient documentation

## 2024-03-11 DIAGNOSIS — D259 Leiomyoma of uterus, unspecified: Secondary | ICD-10-CM | POA: Insufficient documentation

## 2024-03-11 MED ORDER — ASPIRIN 81 MG PO TBEC: 162.0000 mg | DELAYED_RELEASE_TABLET | Freq: Every day | ORAL | 2 refills | Status: AC

## 2024-03-11 NOTE — Patient Instructions (Signed)
 Brittney Collins, thank you for choosing our office today! We appreciate the opportunity to meet your healthcare needs. You may receive a short survey by mail, e-mail, or through [employer]. If you are happy with your care we would appreciate if you could take just a few minutes to complete the survey questions. We read all of your comments and take your feedback very seriously. Thank you again for choosing our office.  Center for [employer] [facility] Team at [facility]  [facility] at [facility] ([address] [place], [place] [address]) Entrance C, located off of [address] Free 24/7 valet parking   Nausea & Vomiting Have saltine crackers or pretzels by your bed and eat a few bites before you raise your head out of bed in the morning Eat small frequent meals throughout the day instead of large meals Drink plenty of fluids throughout the day to stay hydrated, just don't drink a lot of fluids with your meals.  This can make your stomach fill up faster making you feel sick Do not brush your teeth right after you eat Products with real ginger are good for nausea, like ginger ale and ginger hard candy Make sure it says made with real ginger! Sucking on sour candy like lemon heads is also good for nausea If your prenatal vitamins make you nauseated, take them at night so you will sleep through the nausea Sea Bands If you feel like you need medicine for the nausea & vomiting please let us  know If you are unable to keep any fluids or food down please let us  know   Constipation Drink plenty of fluid, preferably water, throughout the day Eat foods high in fiber such as fruits, vegetables, and grains Exercise, such as walking, is a good way to keep your bowels regular Drink warm fluids, especially warm prune juice, or decaf coffee Eat a 1/2 cup of real oatmeal (not instant), 1/2 cup applesauce, and 1/2-1 cup warm prune juice every day If needed, you may take Colace (docusate sodium) stool softener  once or twice a day to help keep the stool soft.  If you still are having problems with constipation, you may take Miralax once daily as needed to help keep your bowels regular.   Home Blood Pressure Monitoring for Patients   Your provider has recommended that you check your blood pressure (BP) at least once a week at home. If you do not have a blood pressure cuff at home, one will be provided for you. Contact your provider if you have not received your monitor within 1 week.   Helpful Tips for Accurate Home Blood Pressure Checks  Don't smoke, exercise, or drink caffeine 30 minutes before checking your BP Use the restroom before checking your BP (a full bladder can raise your pressure) Relax in a comfortable upright chair Feet on the ground Left arm resting comfortably on a flat surface at the level of your heart Legs uncrossed Back supported Sit quietly and don't talk Place the cuff on your bare arm Adjust snuggly, so that only two fingertips can fit between your skin and the top of the cuff Check 2 readings separated by at least one minute Keep a log of your BP readings For a visual, please reference this diagram: http://ccnc.care/bpdiagram  Provider Name: [facility]     Phone: [phone]  Zone 1: ALL CLEAR  Continue to monitor your symptoms:  BP reading is less than 140 (top number) or less than 90 (bottom  number)  No right upper stomach pain No headaches or seeing spots No feeling nauseated or throwing up No swelling in face and hands  Zone 2: CAUTION Call your doctor's office for any of the following:  BP reading is greater than 140 (top number) or greater than 90 (bottom number)  Stomach pain under your ribs in the middle or right side Headaches or seeing spots Feeling nauseated or throwing up Swelling in face and hands  Zone 3: EMERGENCY  Seek immediate medical care if you have any of the following:  BP reading is greater than160 (top number) or greater than  110 (bottom number) Severe headaches not improving with Tylenol Serious difficulty catching your breath Any worsening symptoms from Zone 2    First Trimester of Pregnancy The first trimester of pregnancy is from week 1 until the end of week 12 (months 1 through 3). A week after a sperm fertilizes an egg, the egg will implant on the wall of the uterus. This embryo will begin to develop into a baby. Genes from you and your partner are forming the baby. The female genes determine whether the baby is a boy or a girl. At 6-8 weeks, the eyes and face are formed, and the heartbeat can be seen on ultrasound. At the end of 12 weeks, all the baby's organs are formed.  Now that you are pregnant, you will want to do everything you can to have a healthy baby. Two of the most important things are to get good prenatal care and to follow your health care provider's instructions. Prenatal care is all the medical care you receive before the baby's birth. This care will help prevent, find, and treat any problems during the pregnancy and childbirth. BODY CHANGES Your body goes through many changes during pregnancy. The changes vary from woman to woman.  You may gain or lose a couple of pounds at first. You may feel sick to your stomach (nauseous) and throw up (vomit). If the vomiting is uncontrollable, call your health care provider. You may tire easily. You may develop headaches that can be relieved by medicines approved by your health care provider. You may urinate more often. Painful urination may mean you have a bladder infection. You may develop heartburn as a result of your pregnancy. You may develop constipation because certain hormones are causing the muscles that push waste through your intestines to slow down. You may develop hemorrhoids or swollen, bulging veins (varicose veins). Your breasts may begin to grow larger and become tender. Your nipples may stick out more, and the tissue that surrounds them  (areola) may become darker. Your gums may bleed and may be sensitive to brushing and flossing. Dark spots or blotches (chloasma, mask of pregnancy) may develop on your face. This will likely fade after the baby is born. Your menstrual periods will stop. You may have a loss of appetite. You may develop cravings for certain kinds of food. You may have changes in your emotions from day to day, such as being excited to be pregnant or being concerned that something may go wrong with the pregnancy and baby. You may have more vivid and strange dreams. You may have changes in your hair. These can include thickening of your hair, rapid growth, and changes in texture. Some women also have hair loss during or after pregnancy, or hair that feels dry or thin. Your hair will most likely return to normal after your baby is born. WHAT TO EXPECT AT YOUR PRENATAL  VISITS During a routine prenatal visit: You will be weighed to make sure you and the baby are growing normally. Your blood pressure will be taken. Your abdomen will be measured to track your baby's growth. The fetal heartbeat will be listened to starting around week 10 or 12 of your pregnancy. Test results from any previous visits will be discussed. Your health care provider may ask you: How you are feeling. If you are feeling the baby move. If you have had any abnormal symptoms, such as leaking fluid, bleeding, severe headaches, or abdominal cramping. If you have any questions. Other tests that may be performed during your first trimester include: Blood tests to find your blood type and to check for the presence of any previous infections. They will also be used to check for low iron levels (anemia) and Rh antibodies. Later in the pregnancy, blood tests for diabetes will be done along with other tests if problems develop. Urine tests to check for infections, diabetes, or protein in the urine. An ultrasound to confirm the proper growth and development  of the baby. An amniocentesis to check for possible genetic problems. Fetal screens for spina bifida and Down syndrome. You may need other tests to make sure you and the baby are doing well. HOME CARE INSTRUCTIONS  Medicines Follow your health care provider's instructions regarding medicine use. Specific medicines may be either safe or unsafe to take during pregnancy. Take your prenatal vitamins as directed. If you develop constipation, try taking a stool softener if your health care provider approves. Diet Eat regular, well-balanced meals. Choose a variety of foods, such as meat or vegetable-based protein, fish, milk and low-fat dairy products, vegetables, fruits, and whole grain breads and cereals. Your health care provider will help you determine the amount of weight gain that is right for you. Avoid raw meat and uncooked cheese. These carry germs that can cause birth defects in the baby. Eating four or five small meals rather than three large meals a day may help relieve nausea and vomiting. If you start to feel nauseous, eating a few soda crackers can be helpful. Drinking liquids between meals instead of during meals also seems to help nausea and vomiting. If you develop constipation, eat more high-fiber foods, such as fresh vegetables or fruit and whole grains. Drink enough fluids to keep your urine clear or pale yellow. Activity and Exercise Exercise only as directed by your health care provider. Exercising will help you: Control your weight. Stay in shape. Be prepared for labor and delivery. Experiencing pain or cramping in the lower abdomen or low back is a good sign that you should stop exercising. Check with your health care provider before continuing normal exercises. Try to avoid standing for long periods of time. Move your legs often if you must stand in one place for a long time. Avoid heavy lifting. Wear low-heeled shoes, and practice good posture. You may continue to have sex  unless your health care provider directs you otherwise. Relief of Pain or Discomfort Wear a good support bra for breast tenderness.   Take warm sitz baths to soothe any pain or discomfort caused by hemorrhoids. Use hemorrhoid cream if your health care provider approves.   Rest with your legs elevated if you have leg cramps or low back pain. If you develop varicose veins in your legs, wear support hose. Elevate your feet for 15 minutes, 3-4 times a day. Limit salt in your diet. Prenatal Care Schedule your prenatal visits by the  twelfth week of pregnancy. They are usually scheduled monthly at first, then more often in the last 2 months before delivery. Write down your questions. Take them to your prenatal visits. Keep all your prenatal visits as directed by your health care provider. Safety Wear your seat belt at all times when driving. Make a list of emergency phone numbers, including numbers for family, friends, the hospital, and police and fire departments. General Tips Ask your health care provider for a referral to a local prenatal education class. Begin classes no later than at the beginning of month 6 of your pregnancy. Ask for help if you have counseling or nutritional needs during pregnancy. Your health care provider can offer advice or refer you to specialists for help with various needs. Do not use hot tubs, steam rooms, or saunas. Do not douche or use tampons or scented sanitary pads. Do not cross your legs for long periods of time. Avoid cat litter boxes and soil used by cats. These carry germs that can cause birth defects in the baby and possibly loss of the fetus by miscarriage or stillbirth. Avoid all smoking, herbs, alcohol, and medicines not prescribed by your health care provider. Chemicals in these affect the formation and growth of the baby. Schedule a dentist appointment. At home, brush your teeth with a soft toothbrush and be gentle when you floss. SEEK MEDICAL CARE IF:   You have dizziness. You have mild pelvic cramps, pelvic pressure, or nagging pain in the abdominal area. You have persistent nausea, vomiting, or diarrhea. You have a bad smelling vaginal discharge. You have pain with urination. You notice increased swelling in your face, hands, legs, or ankles. SEEK IMMEDIATE MEDICAL CARE IF:  You have a fever. You are leaking fluid from your vagina. You have spotting or bleeding from your vagina. You have severe abdominal cramping or pain. You have rapid weight gain or loss. You vomit blood or material that looks like coffee grounds. You are exposed to [place] measles and have never had them. You are exposed to fifth disease or chickenpox. You develop a severe headache. You have shortness of breath. You have any kind of trauma, such as from a fall or a car accident. Document Released: 01/24/2001 Document Revised: 06/16/2013 Document Reviewed: 12/10/2012 [facility] Patient Information 2015 [place], [place]. This information is not intended to replace advice given to you by your health care provider. Make sure you discuss any questions you have with your health care provider.

## 2024-03-11 NOTE — Progress Notes (Signed)
 "   INITIAL OBSTETRICAL VISIT Patient name: Brittney Collins MRN [id number]  Date of birth: 05/02/1990 Chief Complaint:   Initial Prenatal Visit  History of Present Illness:   Chandrika Sandles is a 34 y.o. G29P2002 Caucasian female at [redacted]w[redacted]d by LMP c/w u/s at 8 weeks with an Estimated Date of Delivery: 09/19/24 being seen today for her initial obstetrical visit.   Patient's last menstrual period was 12/14/2023 (exact date). Her obstetrical history is significant for term SVB, 9lb2oz, compound hand/arm, 3rd degree lac; then term c/s for breech and PPHTN on meds.   Today she reports no complaints.  Last pap 02/20/22. Results were: NILM w/ HRHPV negative     03/11/2024   11:25 AM 02/20/2022    9:56 AM  Depression screen PHQ 2/9  Decreased Interest 0 0  Down, Depressed, Hopeless 0 0  PHQ - 2 Score 0 0  Altered sleeping 0 0  Tired, decreased energy 1 0  Change in appetite 0 0  Feeling bad or failure about yourself  0 0  Trouble concentrating 0 0  Moving slowly or fidgety/restless 0 0  Suicidal thoughts 0 0  PHQ-9 Score 1 0      Data saved with a previous flowsheet row definition        03/11/2024   11:25 AM 02/20/2022    9:56 AM  GAD 7 : Generalized Anxiety Score  Nervous, Anxious, on Edge 0 0   Control/stop worrying 0 0   Worry too much - different things 0 0   Trouble relaxing 0 0   Restless 0 0   Easily annoyed or irritable 0 0   Afraid - awful might happen 0 0   Total GAD 7 Score 0 0     Data saved with a previous flowsheet row definition     Review of Systems:   Pertinent items are noted in HPI Denies cramping/contractions, leakage of fluid, vaginal bleeding, abnormal vaginal discharge w/ itching/odor/irritation, headaches, visual changes, shortness of breath, chest pain, abdominal pain, severe nausea/vomiting, or problems with urination or bowel movements unless otherwise stated above.  Pertinent History Reviewed:  Reviewed past medical,surgical, social,  obstetrical and family history.  Reviewed problem list, medications and allergies. OB History  Gravida Para Term Preterm AB Living  3 2 2   2   SAB IAB Ectopic Multiple Live Births     0 2    # Outcome Date GA Lbr Len/2nd Weight Sex Type Anes PTL Lv  3 Current           2 Term 08/19/22 [redacted]w[redacted]d  7 lb 11.5 oz (3.5 kg) M CS-LTranv Spinal  LIV     Complications: Breech presentation, Postpartum hypertension  1 Term 05/26/17 [redacted]w[redacted]d  9 lb 2 oz (4.139 kg) M Vag-Spont None N LIV   Physical Assessment:   Vitals:   03/11/24 1124  BP: 121/81  Pulse: 72  Weight: 135 lb (61.2 kg)  Body mass index is 21.79 kg/m.       Physical Examination:  General appearance - well appearing, and in no distress  Mental status - alert, oriented to person, place, and time  Psych:  She has a normal mood and affect  Skin - warm and dry, normal color, no suspicious lesions noted  Chest - effort normal, all lung fields clear to auscultation bilaterally  Heart - normal rate and regular rhythm  Abdomen - soft, nontender  Extremities:  No swelling or varicosities noted  Thin prep pap  is not done   Chaperone: N/A  TODAY'S informal TA u/s: +FCA and active fetus  No results found for this or any previous visit (from the past 24 hours).  Assessment & Plan:  1) Low-Risk Pregnancy G3P2002 at [redacted]w[redacted]d with an Estimated Date of Delivery: 09/19/24   2) Initial OB visit  3) H/O PPHTN> ASA, baseline labs  4) H/O LGA w/ 3rd deg lac> compound hand/arm  5) Prev c/s> for breech, gave TOLAC consent to review  Meds:  Meds ordered this encounter  Medications   aspirin  EC 81 MG tablet    Sig: Take 2 tablets (162 mg total) by mouth daily. Swallow whole.    Dispense:  180 tablet    Refill:  2    Initial labs obtained Continue prenatal vitamins Reviewed n/v relief measures and warning s/s to report Reviewed recommended weight gain based on pre-gravid BMI Encouraged well-balanced diet Genetic & carrier screening discussed:  requests Panorama and AFP, declines Horizon  Ultrasound discussed; fetal survey: requested CCNC completed> form faxed if has or is planning to apply for medicaid The nature of [facility] [employer] with multiple MDs and other Advanced Practice Providers was explained to patient; also emphasized that fellows, residents, and students are part of our team. Does have home bp cuff. Office bp cuff given: no. Rx sent: no. Check bp weekly, let us  know if consistently >140/90.   Follow-up: Return in about 4 weeks (around 04/08/2024) for LROB, AFP, CNM, in person; then @ 20w for anatomy u/s and LROB w/ CNM.   Orders Placed This Encounter  Procedures   Urine Culture   PANORAMA PRENATAL TEST   CBC/D/Plt+RPR+Rh+ABO+RubIgG...   Comprehensive metabolic panel with GFR   Protein / creatinine ratio, urine   Hemoglobin A1c    Suzen JONELLE Fetters CNM, [facility] [place] 03/11/2024 11:58 AM  "

## 2024-03-12 LAB — CBC/D/PLT+RPR+RH+ABO+RUBIGG...
Antibody Screen: NEGATIVE
Basophils Absolute: 0 10*3/uL (ref 0.0–0.2)
Basos: 1 %
EOS (ABSOLUTE): 0 10*3/uL (ref 0.0–0.4)
Eos: 1 %
HCV Ab: NONREACTIVE
HIV Screen 4th Generation wRfx: NONREACTIVE
Hematocrit: 45.4 % (ref 34.0–46.6)
Hemoglobin: 15.1 g/dL (ref 11.1–15.9)
Hepatitis B Surface Ag: NEGATIVE
Immature Grans (Abs): 0 10*3/uL (ref 0.0–0.1)
Immature Granulocytes: 0 %
Lymphocytes Absolute: 1.6 10*3/uL (ref 0.7–3.1)
Lymphs: 22 %
MCH: 31.1 pg (ref 26.6–33.0)
MCHC: 33.3 g/dL (ref 31.5–35.7)
MCV: 94 fL (ref 79–97)
Monocytes Absolute: 0.5 10*3/uL (ref 0.1–0.9)
Monocytes: 6 %
Neutrophils Absolute: 5.1 10*3/uL (ref 1.4–7.0)
Neutrophils: 70 %
Platelets: 238 10*3/uL (ref 150–450)
RBC: 4.85 x10E6/uL (ref 3.77–5.28)
RDW: 13.9 % (ref 11.7–15.4)
RPR Ser Ql: NONREACTIVE
Rh Factor: POSITIVE
Rubella Antibodies, IGG: 4.73 {index}
WBC: 7.2 10*3/uL (ref 3.4–10.8)

## 2024-03-12 LAB — HEMOGLOBIN A1C
Est. average glucose Bld gHb Est-mCnc: 108 mg/dL
Hgb A1c MFr Bld: 5.4 % (ref 4.8–5.6)

## 2024-03-12 LAB — COMPREHENSIVE METABOLIC PANEL WITH GFR
ALT: 11 [IU]/L (ref 0–32)
AST: 15 [IU]/L (ref 0–40)
Albumin: 4.4 g/dL (ref 3.9–4.9)
Alkaline Phosphatase: 46 [IU]/L (ref 41–116)
BUN/Creatinine Ratio: 17 (ref 9–23)
BUN: 10 mg/dL (ref 6–20)
Bilirubin Total: 0.3 mg/dL (ref 0.0–1.2)
CO2: 19 mmol/L — ABNORMAL LOW (ref 20–29)
Calcium: 9.3 mg/dL (ref 8.7–10.2)
Chloride: 102 mmol/L (ref 96–106)
Creatinine, Ser: 0.58 mg/dL (ref 0.57–1.00)
Globulin, Total: 2.5 g/dL (ref 1.5–4.5)
Glucose: 76 mg/dL (ref 70–99)
Potassium: 4.1 mmol/L (ref 3.5–5.2)
Sodium: 138 mmol/L (ref 134–144)
Total Protein: 6.9 g/dL (ref 6.0–8.5)
eGFR: 122 mL/min/{1.73_m2}

## 2024-03-12 LAB — PROTEIN / CREATININE RATIO, URINE
Creatinine, Urine: 137.3 mg/dL
Protein, Ur: 20.3 mg/dL
Protein/Creat Ratio: 148 mg/g{creat} (ref 0–200)

## 2024-03-12 LAB — CERVICOVAGINAL ANCILLARY ONLY
Chlamydia: NEGATIVE
Comment: NEGATIVE
Comment: NORMAL
Neisseria Gonorrhea: NEGATIVE

## 2024-03-12 LAB — HCV INTERPRETATION

## 2024-03-13 LAB — URINE CULTURE: Organism ID, Bacteria: NO GROWTH

## 2024-03-18 ENCOUNTER — Ambulatory Visit: Payer: Self-pay | Admitting: Women's Health

## 2024-03-18 LAB — PANORAMA PRENATAL TEST FULL PANEL:PANORAMA TEST PLUS 5 ADDITIONAL MICRODELETIONS: FETAL FRACTION: 3.7

## 2024-04-08 ENCOUNTER — Encounter: Admitting: Women's Health

## 2024-05-05 ENCOUNTER — Other Ambulatory Visit: Admitting: Radiology

## 2024-05-05 ENCOUNTER — Encounter: Admitting: Women's Health
